# Patient Record
Sex: Female | Born: 1950 | Race: White | Hispanic: No | Smoking: Former smoker
Health system: Southern US, Community
[De-identification: ages and names within clinical notes are randomized; demographics above are authoritative.]

## PROBLEM LIST (undated history)

## (undated) DIAGNOSIS — J45909 Unspecified asthma, uncomplicated: Secondary | ICD-10-CM

## (undated) DIAGNOSIS — M549 Dorsalgia, unspecified: Secondary | ICD-10-CM

## (undated) DIAGNOSIS — F329 Major depressive disorder, single episode, unspecified: Secondary | ICD-10-CM

## (undated) DIAGNOSIS — I10 Essential (primary) hypertension: Secondary | ICD-10-CM

## (undated) DIAGNOSIS — M2550 Pain in unspecified joint: Secondary | ICD-10-CM

## (undated) DIAGNOSIS — R718 Other abnormality of red blood cells: Secondary | ICD-10-CM

## (undated) DIAGNOSIS — E785 Hyperlipidemia, unspecified: Secondary | ICD-10-CM

## (undated) DIAGNOSIS — F419 Anxiety disorder, unspecified: Secondary | ICD-10-CM

## (undated) DIAGNOSIS — F32A Depression, unspecified: Secondary | ICD-10-CM

## (undated) DIAGNOSIS — R7303 Prediabetes: Secondary | ICD-10-CM

## (undated) DIAGNOSIS — E538 Deficiency of other specified B group vitamins: Secondary | ICD-10-CM

## (undated) DIAGNOSIS — T7840XA Allergy, unspecified, initial encounter: Secondary | ICD-10-CM

## (undated) HISTORY — DX: Dorsalgia, unspecified: M54.9

## (undated) HISTORY — DX: Prediabetes: R73.03

## (undated) HISTORY — DX: Anxiety disorder, unspecified: F41.9

## (undated) HISTORY — DX: Other abnormality of red blood cells: R71.8

## (undated) HISTORY — DX: Depression, unspecified: F32.A

## (undated) HISTORY — PX: ADENOIDECTOMY: SUR15

## (undated) HISTORY — PX: COLONOSCOPY: SHX174

## (undated) HISTORY — DX: Essential (primary) hypertension: I10

## (undated) HISTORY — DX: Unspecified asthma, uncomplicated: J45.909

## (undated) HISTORY — PX: KIDNEY STONE SURGERY: SHX686

## (undated) HISTORY — DX: Deficiency of other specified B group vitamins: E53.8

## (undated) HISTORY — DX: Pain in unspecified joint: M25.50

## (undated) HISTORY — DX: Hyperlipidemia, unspecified: E78.5

## (undated) HISTORY — PX: TONSILLECTOMY: SUR1361

## (undated) HISTORY — PX: FOOT SURGERY: SHX648

## (undated) HISTORY — DX: Allergy, unspecified, initial encounter: T78.40XA

---
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
## 2003-05-21 HISTORY — PX: GASTRIC BYPASS: SHX52

## 2009-06-05 HISTORY — PX: KIDNEY STONE SURGERY: SHX686

## 2010-07-10 HISTORY — PX: ABDOMINAL HYSTERECTOMY: SHX81

## 2019-01-08 ENCOUNTER — Encounter: Payer: Self-pay | Admitting: Podiatry

## 2019-01-08 ENCOUNTER — Ambulatory Visit (INDEPENDENT_AMBULATORY_CARE_PROVIDER_SITE_OTHER): Payer: Medicare Other | Admitting: Podiatry

## 2019-01-08 ENCOUNTER — Other Ambulatory Visit: Payer: Self-pay | Admitting: Podiatry

## 2019-01-08 ENCOUNTER — Ambulatory Visit (INDEPENDENT_AMBULATORY_CARE_PROVIDER_SITE_OTHER): Payer: Medicare Other

## 2019-01-08 VITALS — BP 139/80 | HR 69

## 2019-01-08 DIAGNOSIS — G5762 Lesion of plantar nerve, left lower limb: Secondary | ICD-10-CM | POA: Diagnosis not present

## 2019-01-08 DIAGNOSIS — D3610 Benign neoplasm of peripheral nerves and autonomic nervous system, unspecified: Secondary | ICD-10-CM | POA: Diagnosis not present

## 2019-01-08 DIAGNOSIS — M79672 Pain in left foot: Secondary | ICD-10-CM | POA: Diagnosis not present

## 2019-01-08 NOTE — Patient Instructions (Signed)

## 2019-01-10 NOTE — Progress Notes (Signed)
Subjective:   Patient ID: Heidi Mendoza, female   DOB: 68 y.o.   MRN: [id number]   HPI Patient presents stating with only 25-month history of shooting burning pain in her left forefoot and does not remember injury.  States that is very tender and makes it hard for her to walk and is worse with shoe gear or anything that compresses her foot and patient does not smoke and would like to be active but is not able to due to pain   Review of Systems  All other systems reviewed and are negative.       Objective:  Physical Exam Vitals signs and nursing note reviewed.  Constitutional:      Appearance: She is well-developed.  Pulmonary:     Effort: Pulmonary effort is normal.  Musculoskeletal: Normal range of motion.  Skin:    General: Skin is warm.  Neurological:     Mental Status: She is alert.     Neurovascular status intact muscle strength is adequate range of motion is within normal limits with patient noted to have exquisite discomfort third interspace left with shooting radiating discomfort into the adjacent digits with a positive Mulder sign noted.  The metatarsal phalangeal joint is not currently tender and patient has good digital perfusion well oriented x3     Assessment:  Strong probability for neuroma-like symptoms left with possibility for capsulitis or bony injury     Plan:  H&P x-ray reviewed and today I did a sterile prep of the left third interspace and injected proximal to the nerve with a neural lysis injection of purified alcohol Marcaine solution and we will see results in 2 weeks with the possibility for further injections or possible surgical excision depending on response.  Patient was made aware to be very active on it today  X-ray indicates that there is no bony injury and it appears to be strictly a soft tissue pathology at this time

## 2019-01-22 ENCOUNTER — Encounter: Payer: Self-pay | Admitting: Podiatry

## 2019-01-22 ENCOUNTER — Ambulatory Visit (INDEPENDENT_AMBULATORY_CARE_PROVIDER_SITE_OTHER): Payer: Medicare Other | Admitting: Podiatry

## 2019-01-22 DIAGNOSIS — D3610 Benign neoplasm of peripheral nerves and autonomic nervous system, unspecified: Secondary | ICD-10-CM

## 2019-01-22 NOTE — Patient Instructions (Signed)
Pre-Operative Instructions  Congratulations, you have decided to take an important step towards improving your quality of life.  You can be assured that the doctors and staff at [facility] will be with you every step of the way.  Here are some important things you should know:  1. Plan to be at the surgery center/hospital at least 1 (one) hour prior to your scheduled time, unless otherwise directed by the surgical center/hospital staff.  You must have a responsible adult accompany you, remain during the surgery and drive you home.  Make sure you have directions to the surgical center/hospital to ensure you arrive on time. 2. If you are having surgery at [facility] or [facility] hospitals, you will need a copy of your medical history and physical form from your family physician within one month prior to the date of surgery. We will give you a form for your primary physician to complete.  3. We make every effort to accommodate the date you request for surgery.  However, there are times where surgery dates or times have to be moved.  We will contact you as soon as possible if a change in schedule is required.   4. No aspirin/ibuprofen for one week before surgery.  If you are on aspirin, any non-steroidal anti-inflammatory medications (Mobic, Aleve, Ibuprofen) should not be taken seven (7) days prior to your surgery.  You make take Tylenol for pain prior to surgery.  5. Medications - If you are taking daily heart and blood pressure medications, seizure, reflux, allergy, asthma, anxiety, pain or diabetes medications, make sure you notify the surgery center/hospital before the day of surgery so they can tell you which medications you should take or avoid the day of surgery. 6. No food or drink after midnight the night before surgery unless directed otherwise by surgical center/hospital staff. 7. No alcoholic beverages 24-hours prior to surgery.  No smoking 24-hours prior or 24-hours after  surgery. 8. Wear loose pants or shorts. They should be loose enough to fit over bandages, boots, and casts. 9. Don't wear slip-on shoes. Sneakers are preferred. 10. Bring your boot with you to the surgery center/hospital.  Also bring crutches or a walker if your physician has prescribed it for you.  If you do not have this equipment, it will be provided for you after surgery. 11. If you have not been contacted by the surgery center/hospital by the day before your surgery, call to confirm the date and time of your surgery. 12. Leave-time from work may vary depending on the type of surgery you have.  Appropriate arrangements should be made prior to surgery with your employer. 13. Prescriptions will be provided immediately following surgery by your doctor.  Fill these as soon as possible after surgery and take the medication as directed. Pain medications will not be refilled on weekends and must be approved by the doctor. 14. Remove nail polish on the operative foot and avoid getting pedicures prior to surgery. 15. Wash the night before surgery.  The night before surgery wash the foot and leg well with water and the antibacterial soap provided. Be sure to pay special attention to beneath the toenails and in between the toes.  Wash for at least three (3) minutes. Rinse thoroughly with water and dry well with a towel.  Perform this wash unless told not to do so by your physician.  Enclosed: 1 Ice pack (please put in freezer the night before surgery)   1 Hibiclens skin cleaner     Pre-op instructions  If you have any questions regarding the instructions, please do not hesitate to call our office.  [place]: [address], [place], [place] [address] -- [phone]  [place]: [address] , [place] [address] -- [phone]  [place]: [address]  [place], [place] [address] -- [phone]  [place]: [address], [address], [place], [place] [address] -- [phone]  Website:  [email or web address] 

## 2019-01-23 NOTE — Progress Notes (Signed)
Subjective:   Patient ID: Heidi Mendoza, female   DOB: 68 y.o.   MRN: [id number]   HPI Patient states that she had some improvement from the injection but her symptoms are right back to where they were and stated for the first day she had complete resolution of her symptoms   ROS      Objective:  Physical Exam  Neurovascular status intact with patient's third interspace left showing positive Biagio Borg sign with shooting pains into the adjacent digits and radiating-like discomforts.  The metatarsals are also slightly sore but not to the same degree and she does have mild to moderate digital deformity     Assessment:  Strong probability that the problem is related to the aerobic symptoms with possibility of low-grade capsulitis or digital deformities as part of a complicating or contributing factor     Plan:  H&P condition reviewed and at this point I discussed options and she is opted for surgical intervention.  I allowed her to read the consent form reviewing neurectomy of the third interspace left foot and I explained procedure risk and patient wants surgery.  Patient understands no guarantee as far success and understands all risk as outlined and is comfortable with this and what surgical correction.  Patient is allowed to read consent form line by line and signed the consent form after we reviewed and understands no guarantee this will solve the problem and recovery can take from 3 to 6 months.  Scheduled for outpatient surgery and encouraged to call with questions

## 2019-01-30 ENCOUNTER — Encounter: Payer: Self-pay | Admitting: Allergy & Immunology

## 2019-01-30 ENCOUNTER — Telehealth: Payer: Self-pay | Admitting: *Deleted

## 2019-01-30 ENCOUNTER — Ambulatory Visit (INDEPENDENT_AMBULATORY_CARE_PROVIDER_SITE_OTHER): Payer: Medicare Other | Admitting: Allergy & Immunology

## 2019-01-30 VITALS — BP 124/82 | HR 79 | Temp 98.3°F | Resp 18 | Ht 66.0 in | Wt 201.6 lb

## 2019-01-30 DIAGNOSIS — J302 Other seasonal allergic rhinitis: Secondary | ICD-10-CM | POA: Diagnosis not present

## 2019-01-30 DIAGNOSIS — J3089 Other allergic rhinitis: Secondary | ICD-10-CM

## 2019-01-30 DIAGNOSIS — J4540 Moderate persistent asthma, uncomplicated: Secondary | ICD-10-CM

## 2019-01-30 MED ORDER — EPINEPHRINE 0.3 MG/0.3ML IJ SOAJ: 0.3000 mg | Freq: Once | INTRAMUSCULAR | 2 refills | Status: AC

## 2019-01-30 MED ORDER — ALBUTEROL SULFATE (2.5 MG/3ML) 0.083% IN NEBU: 2.5000 mg | INHALATION_SOLUTION | Freq: Four times a day (QID) | RESPIRATORY_TRACT | 5 refills | Status: DC | PRN

## 2019-01-30 NOTE — Telephone Encounter (Signed)
Medical Release form has been faxed to the [facility] for all medical records, and allergy shot prescriptions. Allergy shot consent has been signed, EMS has been completed and given, and an epipen has been prescribed for allergy injections.

## 2019-01-30 NOTE — Progress Notes (Signed)
NEW PATIENT  Date of Service/Encounter:  01/30/19  Referring provider: Davina Poke, FNP   Assessment:   Moderate persistent asthma, uncomplicated  Seasonal and perennial allergic rhinitis  Plan/Recommendations:   1. Moderate persistent asthma, uncomplicated - Lung testing looked good today. - We are not going to make any changes at all. - Daily controller medication(s): Singulair 10mg  daily and Symbicort 160/4.92mcg two puffs twice daily with spacer - Prior to physical activity: albuterol 2 puffs 10-15 minutes before physical activity. - Rescue medications: albuterol 4 puffs every 4-6 hours as needed - Asthma control goals:  * Full participation in all desired activities (may need albuterol before activity) * Albuterol use two time or less a week on average (not counting use with activity) * Cough interfering with sleep two time or less a month * Oral steroids no more than once a year * No hospitalizations  2. Seasonal and perennial allergic rhinitis - We will get your allergy vials and start them again once we get them. - Continue with Singulair (montelukast) 10mg .  - Continue with fluticasone as needed. - We will get outside records.  3. Return in about 6 months (around 07/31/2019).   Subjective:   Heidi Mendoza is a 68 y.o. female presenting today for evaluation of  Chief Complaint  Patient presents with  . Establish Care    Heidi Mendoza has a history of the following: Patient Active Problem List   Diagnosis Date Noted  . Moderate persistent asthma, uncomplicated 26/37/8588  . Seasonal and perennial allergic rhinitis 01/30/2019    History obtained from: chart review and patient.  [name] was referred by Davina Poke, FNP.     Heidi Mendoza is a 68 y.o. female presenting to establish care. She recently moved here from [facility].  She moved here within the last month or so to be with her boyfriend who lives in [place].   Asthma/Respiratory  Symptom History: She has a history of what seems to be a moderate persistent asthma.  Her triggers include heat as well as allergic triggers.  Exercise is also a trigger.  She is currently on Symbicort 160/4.5 mcg 2 puffs twice daily.  She has been on this regimen for 2 years without any prednisone burst.  She has had no hospitalizations or ER visits during this time.  She has not tried decreasing her dose.  ACT score today is 19, indicating excellent asthma control.  Allergic Rhinitis Symptom History: She has a history of chronic congestion as well as sneezing and itchy watery eyes.  She is currently on Singulair daily as well as Claritin as needed and fluticasone as needed.  She had allergy testing done 2 years ago and is on allergy shots every other week.  We do not have her outside records at this point.  She has done very well on the allergy shots.  She was seen by a board-certified allergist in [facility].  Otherwise, there is no history of other atopic diseases, including food allergies, drug allergies, stinging insect allergies, eczema or urticaria. There is no significant infectious history. Vaccinations are up to date.    Past Medical History: Patient Active Problem List   Diagnosis Date Noted  . Moderate persistent asthma, uncomplicated 50/27/7412  . Seasonal and perennial allergic rhinitis 01/30/2019    Medication List:  Allergies as of 01/30/2019   No Known Allergies     Medication List       Accurate as of January 30, 2019  1:21 PM. Always use your most recent med list.        albuterol (2.5 MG/3ML) 0.083% nebulizer solution Commonly known as:  PROVENTIL Take 3 mLs (2.5 mg total) by nebulization every 6 (six) hours as needed for wheezing or shortness of breath.   citalopram 20 MG tablet Commonly known as:  CELEXA Take 20 mg by mouth daily.   EPINEPHrine 0.3 mg/0.3 mL Soaj injection Commonly known as:  EPIPEN 2-PAK Inject 0.3 mLs (0.3 mg total) into the  muscle once for 1 dose.   ESTROVEN PO Take by mouth.   Fish Oil 1200 MG Cpdr Take 1 tablet by mouth daily.   FLUTICASONE PROPIONATE EX Apply topically.   Fluticasone-Salmeterol 500-50 MCG/DOSE Aepb Commonly known as:  ADVAIR Inhale 1 puff into the lungs 2 (two) times daily.   montelukast 10 MG tablet Commonly known as:  SINGULAIR Take 10 mg by mouth at bedtime.   simvastatin 20 MG tablet Commonly known as:  ZOCOR Take 20 mg by mouth daily.   STOOL SOFTENER 100 MG capsule Generic drug:  docusate sodium Take 100 mg by mouth 2 (two) times daily.   telmisartan 20 MG tablet Commonly known as:  MICARDIS Take 20 mg by mouth daily.   TURMERIC COMPLEX/BLACK PEPPER 3-500 MG Caps Generic drug:  Black Pepper-Turmeric Take 500 mg by mouth.   vitamin B-12 1000 MCG tablet Commonly known as:  CYANOCOBALAMIN Take 5,000 mcg by mouth daily.   Vitamin D3 1.25 MG (50000 UT) Caps Take 2,000 mg by mouth.   vitamin E 400 UNIT capsule Take 400 Units by mouth daily.       Birth History: non-contributory  Developmental History: non-contributory.   Past Surgical History: Past Surgical History:  Procedure Laterality Date  . ABDOMINAL HYSTERECTOMY Bilateral 07/10/2010  . ADENOIDECTOMY    . 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 KIDNEY STONE SURGERY N/A 06/05/2009  . TONSILLECTOMY       Family History: History reviewed. No pertinent family history.   Social History: Anelis lives at home with her boyfriend.  She currently volunteers at [employer].  She is widowed, as her husband was killed following injury sustained during an explosion in 2008/04/06.  He worked as an Hotel manager for [employer] and industrial sites.  Her husband died in 04-06-2008, and she is now dating someone who lives in [place].  She moved to [place] to be closer to him.  They both are active with various volunteer activities.  She does have 2 sons.  One lives in [place] and one lives in [place].  She also has a  handful of grandchildren.  Her first husband was in the [employer] and they moved around quite a lot.    Review of Systems: a 14-point review of systems is pertinent for what is mentioned in HPI.  Otherwise, all other systems were negative. Constitutional: negative other than that listed in the HPI Eyes: negative other than that listed in the HPI Ears, nose, mouth, throat, and face: negative other than that listed in the HPI Respiratory: negative other than that listed in the HPI Cardiovascular: negative other than that listed in the HPI Gastrointestinal: negative other than that listed in the HPI Genitourinary: negative other than that listed in the HPI Integument: negative other than that listed in the HPI Hematologic: negative other than that listed in the HPI Musculoskeletal: negative other than that listed in the HPI Neurological: negative other than that listed in the HPI Allergy/Immunologic: negative other than that listed  in the HPI    Objective:   Blood pressure 124/82, pulse 79, temperature 98.3 F (36.8 C), temperature source Oral, resp. rate 18, height 5\' 6"  (1.676 m), weight 201 lb 9.6 oz (91.4 kg), SpO2 93 %. Body mass index is 32.54 kg/m.   Physical Exam:  General: Alert, interactive, in no acute distress. Pleasant female.  Eyes: No conjunctival injection bilaterally, no discharge on the right, no discharge on the left and no Horner-Trantas dots present. PERRL bilaterally. EOMI without pain. No photophobia.  Ears: Right TM pearly gray with normal light reflex, Left TM pearly gray with normal light reflex, Right TM intact without perforation and Left TM intact without perforation.  Nose/Throat: External nose within normal limits and septum midline. Turbinates edematous and pale with clear discharge. Posterior oropharynx erythematous with cobblestoning in the posterior oropharynx. Tonsils 2+ without exudates.  Tongue without thrush. Neck: Supple without thyromegaly.  Trachea midline. Adenopathy: no enlarged lymph nodes appreciated in the anterior cervical, occipital, axillary, epitrochlear, inguinal, or popliteal regions. Lungs: Clear to auscultation without wheezing, rhonchi or rales. No increased work of breathing. CV: Normal S1/S2. No murmurs. Capillary refill <2 seconds.  Abdomen: Nondistended, nontender. No guarding or rebound tenderness. Bowel sounds present in all fields and hypoactive  Skin: Warm and dry, without lesions or rashes. Extremities:  No clubbing, cyanosis or edema. Neuro:   Grossly intact. No focal deficits appreciated. Responsive to questions.  Diagnostic studies:   Spirometry: results normal (FEV1: 2.01/77%, FVC: 2.75/80%, FEV1/FVC: 73%).    Spirometry consistent with normal pattern.  Allergy Studies: none     Allergy testing results were read and interpreted by myself, documented by clinical staff.       Salvatore Marvel, MD Allergy and [place] [facility] [place]

## 2019-01-30 NOTE — Telephone Encounter (Signed)
The patient is transferring vials to our office from [facility]. Called and talked with the Injection room at [facility] to receive vials, they stated that they are shipping her vials out tomorrow and will include paperwork to be filled out by Dr. Ernst Bowler. Once the vials do come in an appointment needs to be made for Heidi Mendoza to come to our office to continue her allergy injections. At this moment it has been over 1 month since she has received her last allergy injection.

## 2019-01-30 NOTE — Patient Instructions (Addendum)
1. Moderate persistent asthma, uncomplicated - Lung testing looked good today. - We are not going to make any changes at all. - Daily controller medication(s): Singulair 10mg  daily and Symbicort 160/4.41mcg two puffs twice daily with spacer - Prior to physical activity: albuterol 2 puffs 10-15 minutes before physical activity. - Rescue medications: albuterol 4 puffs every 4-6 hours as needed - Asthma control goals:  * Full participation in all desired activities (may need albuterol before activity) * Albuterol use two time or less a week on average (not counting use with activity) * Cough interfering with sleep two time or less a month * Oral steroids no more than once a year * No hospitalizations  2. Seasonal and perennial allergic rhinitis - We will get your allergy vials and start them again once we get them. - Continue with Singulair (montelukast) 10mg .  - Continue with fluticasone as needed. - We will get outside records.  3. Return in about 6 months (around 07/31/2019).   Please inform us of any Emergency Department visits, hospitalizations, or changes in symptoms. Call us before going to the ED for breathing or allergy symptoms since we might be able to fit you in for a sick visit. Feel free to contact us anytime with any questions, problems, or concerns.  It was a pleasure to meet you today!  Websites that have reliable patient information: 1. American Academy of Asthma, Allergy, and Immunology: www.aaaai.org 2. Food Allergy Research and Education (FARE): foodallergy.org 3. Mothers of Asthmatics: http://www.asthmacommunitynetwork.org 4. American College of Allergy, Asthma, and Immunology: MonthlyElectricBill.co.uk   Make sure you are registered to vote! If you have moved or changed any of your contact information, you will need to get this updated before voting!    Voter ID laws are POSSIBLY going into effect for the General Election in November 2020! Be prepared! Check out  http://levine.com/ for more details.

## 2019-01-31 ENCOUNTER — Telehealth: Payer: Self-pay | Admitting: *Deleted

## 2019-01-31 NOTE — Telephone Encounter (Signed)
"  I'm a patient of Dr. Paulla Dolly and I'm scheduled for foot surgery on February 24.  I have a question I want to ask him.  The ball of my foot is what's hurting.  He's taking out a nerve on the top of my foot.  How is that going to resolve the problem on the ball of my foot?  That's not where the pain is.  I want to make sure he knows that before I have the surgery."

## 2019-02-05 NOTE — Telephone Encounter (Signed)
I called and left her a message on her voicemail of Dr. Mellody Drown response.  I asked her to call if she has any further questions.

## 2019-02-05 NOTE — Telephone Encounter (Signed)
The nerve is on the bottom. Easier to recover taking nerve out from the top

## 2019-02-13 ENCOUNTER — Telehealth: Payer: Self-pay | Admitting: *Deleted

## 2019-02-13 NOTE — Telephone Encounter (Signed)
I left her a message that we have her surgery at [facility].  Their address is [address] in [place].  I informed her that she is scheduled for February 25 and that someone from the surgical center would call her the Friday or Monday prior to her surgery date.  They will give her the arrival time.  I told her she would not be wearing a boot but will be wearing a surgical shoe after her surgery.  I asked her to call with any other questions she may have.

## 2019-02-13 NOTE — Telephone Encounter (Signed)
"  Could you tell me what time I need to be at the hospital and when my surgery?  Is he going to put me in a boot after my foot heals?  Just give me a call."

## 2019-02-19 ENCOUNTER — Telehealth: Payer: Self-pay | Admitting: *Deleted

## 2019-02-19 NOTE — Telephone Encounter (Signed)
"  I am calling to see what time I need to be there for my surgery tomorrow.  Can you tell me?"  I cannot tell you the arrival time.  You can call the surgical center and ask someone.  "Do you have their phone number?"  Yes, it is [phone].

## 2019-02-20 ENCOUNTER — Encounter: Payer: Self-pay | Admitting: Podiatry

## 2019-02-20 DIAGNOSIS — G5762 Lesion of plantar nerve, left lower limb: Secondary | ICD-10-CM | POA: Diagnosis not present

## 2019-02-21 ENCOUNTER — Telehealth: Payer: Self-pay | Admitting: *Deleted

## 2019-02-21 NOTE — Telephone Encounter (Signed)
Called and left a message for the patient to call me back-stating I was calling to check on her after having surgery with Dr Paulla Dolly on February 20, 2019 and stated to call the [place] office at [phone]. Lattie Haw

## 2019-02-22 ENCOUNTER — Encounter: Payer: Self-pay | Admitting: Podiatry

## 2019-02-23 ENCOUNTER — Encounter: Payer: Self-pay | Admitting: Podiatry

## 2019-02-26 NOTE — Telephone Encounter (Signed)
Pt called states the loose fitting brown sock could be replaced with the hospital sock. I told pt the loose sock was to keep the surgical dressing in place and clean, if she could get the hospital sock over the dressing without disturbing that would be fine. Pt states understanding.

## 2019-02-26 NOTE — Telephone Encounter (Signed)
Left message for pt to call to discuss the brown sock and gray sock question.

## 2019-03-02 ENCOUNTER — Ambulatory Visit (INDEPENDENT_AMBULATORY_CARE_PROVIDER_SITE_OTHER): Payer: Medicare Other | Admitting: Podiatry

## 2019-03-02 ENCOUNTER — Encounter: Payer: Self-pay | Admitting: Podiatry

## 2019-03-02 DIAGNOSIS — D3610 Benign neoplasm of peripheral nerves and autonomic nervous system, unspecified: Secondary | ICD-10-CM

## 2019-03-02 DIAGNOSIS — G5762 Lesion of plantar nerve, left lower limb: Secondary | ICD-10-CM | POA: Diagnosis not present

## 2019-03-02 DIAGNOSIS — Z09 Encounter for follow-up examination after completed treatment for conditions other than malignant neoplasm: Secondary | ICD-10-CM

## 2019-03-07 NOTE — Progress Notes (Signed)
Subjective:   Patient ID: Heidi Mendoza, female   DOB: 68 y.o.   MRN: [id number]   HPI Patient states she is doing great with her left foot   ROS      Objective:  Physical Exam  Neurovascular status intact with patient's left foot doing well with wound edges well coapted edema reduced with minimal discomfort noted and mild numbness between the digits     Assessment:  Doing very well post neurectomy third interspace left     Plan:  Reviewed the continuation of conservative care gradual increase in activity levels and patient will be seen back to recheck on an as-needed basis and is encouraged to call if she has any concerns that occur

## 2019-03-14 NOTE — Progress Notes (Signed)
We received the patient's outside medical records from [facility].  Her testing was done in June 2016 and was positive to all the grasses, all of the trees, all of the weeds, dust mites, dogs, and all of the mold mixes.  Currently, she is completing some vials that were made at this clinic prior to her move here. Once these are done, we will remix them here.  Salvatore Marvel, MD [facility] and [place] [facility] [place]

## 2019-03-15 ENCOUNTER — Encounter: Payer: Self-pay | Admitting: Podiatry

## 2019-03-15 ENCOUNTER — Telehealth: Payer: Self-pay

## 2019-03-15 DIAGNOSIS — J302 Other seasonal allergic rhinitis: Secondary | ICD-10-CM

## 2019-03-15 DIAGNOSIS — J3089 Other allergic rhinitis: Principal | ICD-10-CM

## 2019-03-15 NOTE — Telephone Encounter (Signed)
Provider notified

## 2019-03-15 NOTE — Telephone Encounter (Signed)
-----   Message from Valentina Shaggy, MD sent at 03/14/2019  6:31 AM EDT ----- Can someone remind me to take a look at this person's vials when I am there tomorrow so I can re-order them in our system?

## 2019-03-16 ENCOUNTER — Ambulatory Visit: Payer: Medicare Other

## 2019-03-16 MED ORDER — PREDNISONE 10 MG PO TABS: ORAL_TABLET | ORAL | 0 refills | Status: DC

## 2019-03-16 NOTE — Telephone Encounter (Signed)
I called the patient to discuss her outside records ad her allergy vial situation.  She does report that she was allergic to the entire panel, as was recorded with the testing.  She also tells me that she has been on the Silver Vial for over a year.  She is unclear why she is only on the silver vial but said she was coming in once a week for this.  She did feel some relief from it.  She has only ever received one injection at each visit and has never received any other colored vial.   I explained to her that some allergens can mix together because they degrade each other. She said that she was not aware of that, but she is interested in getting new vials made completely. I explained the schedule to her and recommended that she come next Friday to start instead. I am going to put her on twice weekly injections for now so that she can build up to maintenance. She is fine with this plan.   I was going to get an environmental allergy panel, but given the fact that I confirmed with the patient that she was positive to everything, I think we can hold off on that. I did send in some prednisone for her since she tells me that she was already having symptoms. We will plan to start her vials on Friday March 27th. I will route the note to [employer].   Salvatore Marvel, MD [facility] [place] [facility]

## 2019-03-16 NOTE — Addendum Note (Signed)
Addended by: Valentina Shaggy on: 03/16/2019 08:48 AM   Modules accepted: Orders

## 2019-03-16 NOTE — Addendum Note (Signed)
Addended by: Valentina Shaggy on: 03/16/2019 12:39 PM   Modules accepted: Orders

## 2019-03-21 DIAGNOSIS — J301 Allergic rhinitis due to pollen: Secondary | ICD-10-CM | POA: Diagnosis not present

## 2019-03-21 NOTE — Progress Notes (Signed)
VIALS EXP 03-20-2020

## 2019-03-22 DIAGNOSIS — J3089 Other allergic rhinitis: Secondary | ICD-10-CM | POA: Diagnosis not present

## 2019-03-23 ENCOUNTER — Ambulatory Visit (INDEPENDENT_AMBULATORY_CARE_PROVIDER_SITE_OTHER): Payer: Medicare Other | Admitting: *Deleted

## 2019-03-23 DIAGNOSIS — J309 Allergic rhinitis, unspecified: Secondary | ICD-10-CM

## 2019-03-23 NOTE — Progress Notes (Signed)
Immunotherapy   Patient Details  Name: Heidi Mendoza MRN: [id number] Date of Birth: 05-01-1951  03/23/2019  [name] started injections for  Mold-Dmite & Grass-Weed-Dog Following schedule: B  Frequency:2 times per week Epi-Pen:Epi-Pen Available  Consent signed and patient instructions given. No problems after 30 minutes in the office.   [name] 03/23/2019, 9:55 AM

## 2019-03-30 ENCOUNTER — Ambulatory Visit (INDEPENDENT_AMBULATORY_CARE_PROVIDER_SITE_OTHER): Payer: Medicare Other | Admitting: *Deleted

## 2019-03-30 DIAGNOSIS — J309 Allergic rhinitis, unspecified: Secondary | ICD-10-CM

## 2019-04-09 ENCOUNTER — Ambulatory Visit (INDEPENDENT_AMBULATORY_CARE_PROVIDER_SITE_OTHER): Payer: Medicare Other

## 2019-04-09 DIAGNOSIS — J309 Allergic rhinitis, unspecified: Secondary | ICD-10-CM | POA: Diagnosis not present

## 2019-04-16 ENCOUNTER — Ambulatory Visit (INDEPENDENT_AMBULATORY_CARE_PROVIDER_SITE_OTHER): Payer: Medicare Other

## 2019-04-16 DIAGNOSIS — J309 Allergic rhinitis, unspecified: Secondary | ICD-10-CM

## 2019-04-23 ENCOUNTER — Ambulatory Visit (INDEPENDENT_AMBULATORY_CARE_PROVIDER_SITE_OTHER): Payer: Medicare Other | Admitting: *Deleted

## 2019-04-23 DIAGNOSIS — J309 Allergic rhinitis, unspecified: Secondary | ICD-10-CM | POA: Diagnosis not present

## 2019-04-30 ENCOUNTER — Ambulatory Visit (INDEPENDENT_AMBULATORY_CARE_PROVIDER_SITE_OTHER): Payer: Medicare Other

## 2019-04-30 DIAGNOSIS — J309 Allergic rhinitis, unspecified: Secondary | ICD-10-CM

## 2019-05-07 ENCOUNTER — Ambulatory Visit (INDEPENDENT_AMBULATORY_CARE_PROVIDER_SITE_OTHER): Payer: Medicare Other

## 2019-05-07 DIAGNOSIS — J309 Allergic rhinitis, unspecified: Secondary | ICD-10-CM

## 2019-05-09 ENCOUNTER — Telehealth: Payer: Self-pay | Admitting: Allergy & Immunology

## 2019-05-09 NOTE — Telephone Encounter (Signed)
Patient has a prescription from a former doctor, called simvastatin. She wants to know if Dr. Ernst Bowler wants her to stay on it and if so, if he will send in a prescription for it to [facility], for a 90 day supply. [phone]

## 2019-05-09 NOTE — Telephone Encounter (Signed)
Called patient and informed that this is a medication for cholesterol and that the patient will need to contact her PCP to refill. Patient verbalized understanding.

## 2019-05-14 ENCOUNTER — Ambulatory Visit (INDEPENDENT_AMBULATORY_CARE_PROVIDER_SITE_OTHER): Payer: Medicare Other

## 2019-05-14 DIAGNOSIS — J309 Allergic rhinitis, unspecified: Secondary | ICD-10-CM

## 2019-05-22 ENCOUNTER — Ambulatory Visit (INDEPENDENT_AMBULATORY_CARE_PROVIDER_SITE_OTHER): Payer: Medicare Other | Admitting: *Deleted

## 2019-05-22 DIAGNOSIS — J309 Allergic rhinitis, unspecified: Secondary | ICD-10-CM | POA: Diagnosis not present

## 2019-05-28 ENCOUNTER — Ambulatory Visit (INDEPENDENT_AMBULATORY_CARE_PROVIDER_SITE_OTHER): Payer: Medicare Other

## 2019-05-28 DIAGNOSIS — J309 Allergic rhinitis, unspecified: Secondary | ICD-10-CM | POA: Diagnosis not present

## 2019-06-04 ENCOUNTER — Ambulatory Visit (INDEPENDENT_AMBULATORY_CARE_PROVIDER_SITE_OTHER): Payer: Medicare Other

## 2019-06-04 DIAGNOSIS — J309 Allergic rhinitis, unspecified: Secondary | ICD-10-CM | POA: Diagnosis not present

## 2019-06-11 ENCOUNTER — Ambulatory Visit (INDEPENDENT_AMBULATORY_CARE_PROVIDER_SITE_OTHER): Payer: Medicare Other | Admitting: *Deleted

## 2019-06-11 DIAGNOSIS — J309 Allergic rhinitis, unspecified: Secondary | ICD-10-CM | POA: Diagnosis not present

## 2019-06-19 ENCOUNTER — Ambulatory Visit (INDEPENDENT_AMBULATORY_CARE_PROVIDER_SITE_OTHER): Payer: Medicare Other | Admitting: *Deleted

## 2019-06-19 DIAGNOSIS — J309 Allergic rhinitis, unspecified: Secondary | ICD-10-CM | POA: Diagnosis not present

## 2019-06-25 ENCOUNTER — Ambulatory Visit (INDEPENDENT_AMBULATORY_CARE_PROVIDER_SITE_OTHER): Payer: Medicare Other | Admitting: *Deleted

## 2019-06-25 DIAGNOSIS — J309 Allergic rhinitis, unspecified: Secondary | ICD-10-CM | POA: Diagnosis not present

## 2019-07-02 ENCOUNTER — Ambulatory Visit (INDEPENDENT_AMBULATORY_CARE_PROVIDER_SITE_OTHER): Payer: Medicare Other

## 2019-07-02 DIAGNOSIS — J309 Allergic rhinitis, unspecified: Secondary | ICD-10-CM

## 2019-07-09 ENCOUNTER — Ambulatory Visit (INDEPENDENT_AMBULATORY_CARE_PROVIDER_SITE_OTHER): Payer: Medicare Other

## 2019-07-09 DIAGNOSIS — J309 Allergic rhinitis, unspecified: Secondary | ICD-10-CM

## 2019-07-17 ENCOUNTER — Ambulatory Visit (INDEPENDENT_AMBULATORY_CARE_PROVIDER_SITE_OTHER): Payer: Medicare Other | Admitting: *Deleted

## 2019-07-17 DIAGNOSIS — J309 Allergic rhinitis, unspecified: Secondary | ICD-10-CM | POA: Diagnosis not present

## 2019-07-23 ENCOUNTER — Ambulatory Visit (INDEPENDENT_AMBULATORY_CARE_PROVIDER_SITE_OTHER): Payer: Medicare Other

## 2019-07-23 DIAGNOSIS — J309 Allergic rhinitis, unspecified: Secondary | ICD-10-CM

## 2019-07-31 ENCOUNTER — Other Ambulatory Visit: Payer: Self-pay

## 2019-07-31 ENCOUNTER — Ambulatory Visit: Payer: Self-pay | Admitting: *Deleted

## 2019-07-31 ENCOUNTER — Encounter: Payer: Self-pay | Admitting: Allergy & Immunology

## 2019-07-31 ENCOUNTER — Ambulatory Visit: Payer: Medicare Other | Admitting: Allergy & Immunology

## 2019-07-31 ENCOUNTER — Ambulatory Visit (INDEPENDENT_AMBULATORY_CARE_PROVIDER_SITE_OTHER): Payer: Medicare Other | Admitting: Allergy & Immunology

## 2019-07-31 VITALS — BP 136/84 | HR 78 | Resp 16 | Ht 65.0 in

## 2019-07-31 DIAGNOSIS — J3089 Other allergic rhinitis: Secondary | ICD-10-CM | POA: Diagnosis not present

## 2019-07-31 DIAGNOSIS — J4540 Moderate persistent asthma, uncomplicated: Secondary | ICD-10-CM | POA: Diagnosis not present

## 2019-07-31 DIAGNOSIS — J302 Other seasonal allergic rhinitis: Secondary | ICD-10-CM | POA: Diagnosis not present

## 2019-07-31 DIAGNOSIS — J309 Allergic rhinitis, unspecified: Secondary | ICD-10-CM

## 2019-07-31 NOTE — Progress Notes (Signed)
FOLLOW UP  Date of Service/Encounter:  07/31/19   Assessment:   Moderate persistent asthma, uncomplicated   Seasonal and perennial allergic rhinitis (grasses, trees, weeds, indoor and outdoor molds, cat, dog, dust mites) - on allergen immunotherapy  Plan/Recommendations:   1. Moderate persistent asthma, uncomplicated - Lung testing looked good today. - We will stop the montelukast since you have done without it for so long.  - Daily controller medication(s): Symbicort 160/4.55mcg two puffs twice daily with spacer - Prior to physical activity: albuterol 2 puffs 10-15 minutes before physical activity. - Rescue medications: albuterol 4 puffs every 4-6 hours as needed - Asthma control goals:  * Full participation in all desired activities (may need albuterol before activity) * Albuterol use two time or less a week on average (not counting use with activity) * Cough interfering with sleep two time or less a month * Oral steroids no more than once a year * No hospitalizations  2. Seasonal and perennial allergic rhinitis - We will get your allergy vials at the same schedule.  - Continue with fluticasone as needed. - Be sure to use an antihistamine on shot days.   3. Return in about 6 months (around 01/31/2020). This can be an in-person, a virtual Webex or a telephone follow up visit.   Subjective:   Heidi Mendoza is a 68 y.o. female presenting today for follow up of  Chief Complaint  Patient presents with  . Allergic Rhinitis     Heidi Mendoza has a history of the following: Patient Active Problem List   Diagnosis Date Noted  . Moderate persistent asthma, uncomplicated 43/15/4008  . Seasonal and perennial allergic rhinitis 01/30/2019    History obtained from: chart review and patient.  Heidi Mendoza is a 68 y.o. female presenting for a follow up visit.  She was last seen as a new patient in February 2020.  At that time, her lung testing looked great.  We did not make any medication  changes and instead continued with Singulair 10 mg daily and Symbicort 160/4.5 mcg 2 puffs in the morning and 2 puffs at night.  She was interested in restarting her allergy shots.  We got them from her previous allergist.  We continued with Singulair 10 mg daily as well as Flonase as needed.  Since last visit, she has done well.  Asthma/Respiratory Symptom History: She remains on the Symbicort 2 puffs in the morning and 2 puffs at night.  She actually stopped taking her Singulair because she ran out 2 weeks ago.  She feels no different without the Singulair.  She is open to stopping it completely.  She has not been using her rescue inhaler at all.  She reports good sleep.  Allergic Rhinitis Symptom History: She remains on the allergen immunotherapy. She reports that her reactions have been fairly well controlled. She does feel better with the allergy shots compared to when she did not have them.  She also remains on her Flonase, which she is using only as needed.  She only uses antihistamine on her shot days.  Heidi Mendoza is on allergen immunotherapy. She receives two injections. Immunotherapy script #1 contains molds and dust mites. She currently receives 0.59mL of the RED vial (1/100). Immunotherapy script #2 contains weeds, grasses and dog. She currently receives 0.77mL of the RED vial (1/100). She started shots March of 2020 and reached maintenance in August of 2020.  Otherwise, there have been no changes to her past medical history, surgical history, family history, or  social history.  She has been staying safe during the coronavirus pandemic.  She will occasionally visit an isolated friend in [place], [place].  Otherwise, she stays to herself.    Review of Systems  Constitutional: Negative.  Negative for chills, fever, malaise/fatigue and weight loss.  HENT: Negative.  Negative for congestion, ear discharge, ear pain and sore throat.        Positive for mild rhinorrhea.  Eyes: Negative for  pain, discharge and redness.  Respiratory: Negative for cough, sputum production, shortness of breath and wheezing.   Cardiovascular: Negative.  Negative for chest pain and palpitations.  Gastrointestinal: Negative for abdominal pain, constipation, diarrhea, heartburn, nausea and vomiting.  Skin: Negative.  Negative for itching and rash.  Neurological: Negative for dizziness and headaches.  Endo/Heme/Allergies: Negative for environmental allergies. Does not bruise/bleed easily.       Objective:   Blood pressure 136/84, pulse 78, resp. rate 16, height 5\' 5"  (1.651 m), SpO2 96 %. Body mass index is 33.55 kg/m.   Physical Exam:  Physical Exam  Constitutional: She appears well-developed.  Pleasant talkative female.  HENT:  Head: Normocephalic and atraumatic.  Right Ear: Tympanic membrane, external ear and ear canal normal.  Left Ear: Tympanic membrane, external ear and ear canal normal.  Nose: Mucosal edema present. No rhinorrhea, nasal deformity or septal deviation. No epistaxis. Right sinus exhibits no maxillary sinus tenderness and no frontal sinus tenderness. Left sinus exhibits no maxillary sinus tenderness and no frontal sinus tenderness.  Mouth/Throat: Uvula is midline and oropharynx is clear and moist. Mucous membranes are not pale and not dry.  Eyes: Pupils are equal, round, and reactive to light. Conjunctivae and EOM are normal. Right eye exhibits no chemosis and no discharge. Left eye exhibits no chemosis and no discharge. Right conjunctiva is not injected. Left conjunctiva is not injected.  Cardiovascular: Normal rate, regular rhythm and normal heart sounds.  Respiratory: Effort normal and breath sounds normal. No accessory muscle usage. No tachypnea. No respiratory distress. She has no wheezes. She has no rhonchi. She has no rales. She exhibits no tenderness.  Moving air well in all lung fields.  Lymphadenopathy:    She has no cervical adenopathy.  Neurological: She is  alert.  Skin: No abrasion, no petechiae and no rash noted. Rash is not papular, not vesicular and not urticarial. No erythema. No pallor.  No eczematous or urticarial lesions noted.  Psychiatric: She has a normal mood and affect.     Diagnostic studies:    Spirometry: results normal (FEV1: 2.24/81%, FVC: 2.96/79%, FEV1/FVC: 76%).    Spirometry consistent with normal pattern.   Allergy Studies: none      Salvatore Marvel, MD  [facility] [place] [facility] [place]

## 2019-07-31 NOTE — Patient Instructions (Addendum)
1. Moderate persistent asthma, uncomplicated - Lung testing looked good today. - We will stop the montelukast since you have done without it for so long.  - Daily controller medication(s): Symbicort 160/4.67mcg two puffs twice daily with spacer - Prior to physical activity: albuterol 2 puffs 10-15 minutes before physical activity. - Rescue medications: albuterol 4 puffs every 4-6 hours as needed - Asthma control goals:  * Full participation in all desired activities (may need albuterol before activity) * Albuterol use two time or less a week on average (not counting use with activity) * Cough interfering with sleep two time or less a month * Oral steroids no more than once a year * No hospitalizations  2. Seasonal and perennial allergic rhinitis - We will get your allergy vials at the same schedule.  - Continue with fluticasone as needed. - Be sure to use an antihistamine on shot days.   3. Return in about 6 months (around 01/31/2020). This can be an in-person, a virtual Webex or a telephone follow up visit.   Please inform us of any Emergency Department visits, hospitalizations, or changes in symptoms. Call us before going to the ED for breathing or allergy symptoms since we might be able to fit you in for a sick visit. Feel free to contact us anytime with any questions, problems, or concerns.  It was a pleasure to see you again today!  Websites that have reliable patient information: 1. American Academy of Asthma, Allergy, and Immunology: www.aaaai.org 2. Food Allergy Research and Education (FARE): foodallergy.org 3. Mothers of Asthmatics: http://www.asthmacommunitynetwork.org 4. American College of Allergy, Asthma, and Immunology: www.acaai.org  "Like" [place] on Facebook and Instagram for our latest updates!      Make sure you are registered to vote! If you have moved or changed any of your contact information, you will need to get this updated before voting!  In some cases, you MAY  be able to register to vote online: [email or web address]    Voter ID laws are NOT going into effect for the General Election in November 2020! DO NOT let this stop you from exercising your right to vote!   Absentee voting is the SAFEST way to vote during the coronavirus pandemic!   Download and print an absentee ballot request form at [email or web address] or you can scan the QR code below with your smart phone:      More information on absentee ballots can be found here: [email or web address]

## 2019-08-06 ENCOUNTER — Ambulatory Visit (INDEPENDENT_AMBULATORY_CARE_PROVIDER_SITE_OTHER): Payer: Medicare Other | Admitting: *Deleted

## 2019-08-06 DIAGNOSIS — J309 Allergic rhinitis, unspecified: Secondary | ICD-10-CM

## 2019-08-14 ENCOUNTER — Ambulatory Visit (INDEPENDENT_AMBULATORY_CARE_PROVIDER_SITE_OTHER): Payer: Medicare Other | Admitting: *Deleted

## 2019-08-14 DIAGNOSIS — J309 Allergic rhinitis, unspecified: Secondary | ICD-10-CM

## 2019-08-21 ENCOUNTER — Ambulatory Visit (INDEPENDENT_AMBULATORY_CARE_PROVIDER_SITE_OTHER): Payer: Medicare Other | Admitting: *Deleted

## 2019-08-21 DIAGNOSIS — J309 Allergic rhinitis, unspecified: Secondary | ICD-10-CM

## 2019-08-27 ENCOUNTER — Telehealth: Payer: Self-pay | Admitting: Allergy & Immunology

## 2019-08-27 ENCOUNTER — Other Ambulatory Visit: Payer: Self-pay | Admitting: *Deleted

## 2019-08-27 ENCOUNTER — Encounter: Payer: Self-pay | Admitting: Allergy

## 2019-08-27 ENCOUNTER — Ambulatory Visit (INDEPENDENT_AMBULATORY_CARE_PROVIDER_SITE_OTHER): Payer: Medicare Other

## 2019-08-27 DIAGNOSIS — J309 Allergic rhinitis, unspecified: Secondary | ICD-10-CM | POA: Diagnosis not present

## 2019-08-27 MED ORDER — FLUTICASONE PROPIONATE 50 MCG/ACT NA SUSP: 2.0000 | Freq: Every day | NASAL | 1 refills | Status: DC | PRN

## 2019-08-27 NOTE — Progress Notes (Signed)
Patient denied any local or systemic reaction with last injection. However upon return to office with todays injection she admitted that she did have a large local reaction to injections and itching similar to today's reaction with her last injection. I did educate on being honest with the injection staff about any reactions so that we can adjust her dosing accordingly. Patient verbalized understanding of this.

## 2019-08-27 NOTE — Telephone Encounter (Signed)
Prescription has been sent in. Called patient and informed. Patient verbalized understanding.  

## 2019-08-27 NOTE — Telephone Encounter (Signed)
Patient stopped in requesting a refill on a medication from her previous allergist. Fluticasone Propionate Nasal Spray USP. [facility]. 90 day supply.

## 2019-08-27 NOTE — Progress Notes (Signed)
Patient developed itching on her anterior chest and groin area about 1 hour after red 0.4cc. Apparently she had similar symptoms last week but failed to verbalize to our staff.  She was given benadryl 50mg  and prednisone 30mg  in the office. Vitals were stable. Upon discharge was feeling better. She did have large localized reactions.   Will hold red 0.1cc dose for 4 injections and then increase by 0.05cc as tolerated. Advised patient to make sure she takes her antihistamines on the days of her injections.

## 2019-08-28 ENCOUNTER — Telehealth: Payer: Self-pay | Admitting: *Deleted

## 2019-08-28 NOTE — Telephone Encounter (Signed)
Called to follow up with patient after an allergic reaction she had yesterday in office. Left voicemail asking or patient to call back. Will need to follow up with another phone call.

## 2019-08-29 NOTE — Telephone Encounter (Signed)
Called and left a message for patient to call the office to follow up in regards to her reaction.

## 2019-08-30 NOTE — Telephone Encounter (Signed)
I spoke with Heidi Mendoza. She is doing well with no further issues. She will call back with any further questions.

## 2019-09-04 ENCOUNTER — Ambulatory Visit (INDEPENDENT_AMBULATORY_CARE_PROVIDER_SITE_OTHER): Payer: Medicare Other | Admitting: *Deleted

## 2019-09-04 DIAGNOSIS — J309 Allergic rhinitis, unspecified: Secondary | ICD-10-CM

## 2019-09-10 ENCOUNTER — Ambulatory Visit (INDEPENDENT_AMBULATORY_CARE_PROVIDER_SITE_OTHER): Payer: Medicare Other

## 2019-09-10 DIAGNOSIS — J309 Allergic rhinitis, unspecified: Secondary | ICD-10-CM

## 2019-09-17 ENCOUNTER — Ambulatory Visit (INDEPENDENT_AMBULATORY_CARE_PROVIDER_SITE_OTHER): Payer: Medicare Other

## 2019-09-17 DIAGNOSIS — J309 Allergic rhinitis, unspecified: Secondary | ICD-10-CM

## 2019-09-24 ENCOUNTER — Ambulatory Visit (INDEPENDENT_AMBULATORY_CARE_PROVIDER_SITE_OTHER): Payer: Medicare Other

## 2019-09-24 DIAGNOSIS — J309 Allergic rhinitis, unspecified: Secondary | ICD-10-CM | POA: Diagnosis not present

## 2019-10-09 ENCOUNTER — Ambulatory Visit (INDEPENDENT_AMBULATORY_CARE_PROVIDER_SITE_OTHER): Payer: Medicare Other | Admitting: *Deleted

## 2019-10-09 DIAGNOSIS — J309 Allergic rhinitis, unspecified: Secondary | ICD-10-CM | POA: Diagnosis not present

## 2019-10-15 ENCOUNTER — Ambulatory Visit (INDEPENDENT_AMBULATORY_CARE_PROVIDER_SITE_OTHER): Payer: Medicare Other

## 2019-10-15 DIAGNOSIS — J309 Allergic rhinitis, unspecified: Secondary | ICD-10-CM | POA: Diagnosis not present

## 2019-10-23 ENCOUNTER — Ambulatory Visit (INDEPENDENT_AMBULATORY_CARE_PROVIDER_SITE_OTHER): Payer: Medicare Other

## 2019-10-23 DIAGNOSIS — J309 Allergic rhinitis, unspecified: Secondary | ICD-10-CM

## 2019-10-29 ENCOUNTER — Ambulatory Visit (INDEPENDENT_AMBULATORY_CARE_PROVIDER_SITE_OTHER): Payer: Medicare Other

## 2019-10-29 DIAGNOSIS — J309 Allergic rhinitis, unspecified: Secondary | ICD-10-CM | POA: Diagnosis not present

## 2019-11-01 DIAGNOSIS — J301 Allergic rhinitis due to pollen: Secondary | ICD-10-CM

## 2019-11-05 DIAGNOSIS — J3089 Other allergic rhinitis: Secondary | ICD-10-CM | POA: Diagnosis not present

## 2019-11-14 ENCOUNTER — Ambulatory Visit (INDEPENDENT_AMBULATORY_CARE_PROVIDER_SITE_OTHER): Payer: Medicare Other

## 2019-11-14 DIAGNOSIS — J309 Allergic rhinitis, unspecified: Secondary | ICD-10-CM

## 2019-11-20 ENCOUNTER — Ambulatory Visit (INDEPENDENT_AMBULATORY_CARE_PROVIDER_SITE_OTHER): Payer: Medicare Other

## 2019-11-20 DIAGNOSIS — J309 Allergic rhinitis, unspecified: Secondary | ICD-10-CM

## 2019-11-27 ENCOUNTER — Ambulatory Visit (INDEPENDENT_AMBULATORY_CARE_PROVIDER_SITE_OTHER): Payer: Medicare Other | Admitting: *Deleted

## 2019-11-27 DIAGNOSIS — J309 Allergic rhinitis, unspecified: Secondary | ICD-10-CM

## 2019-12-04 ENCOUNTER — Ambulatory Visit (INDEPENDENT_AMBULATORY_CARE_PROVIDER_SITE_OTHER): Payer: Medicare Other

## 2019-12-04 DIAGNOSIS — J309 Allergic rhinitis, unspecified: Secondary | ICD-10-CM | POA: Diagnosis not present

## 2019-12-11 ENCOUNTER — Ambulatory Visit (INDEPENDENT_AMBULATORY_CARE_PROVIDER_SITE_OTHER): Payer: Medicare Other

## 2019-12-11 DIAGNOSIS — J309 Allergic rhinitis, unspecified: Secondary | ICD-10-CM | POA: Diagnosis not present

## 2019-12-12 ENCOUNTER — Ambulatory Visit: Payer: Medicare Other | Admitting: Family

## 2019-12-18 ENCOUNTER — Other Ambulatory Visit: Payer: Self-pay

## 2019-12-18 ENCOUNTER — Encounter: Payer: Self-pay | Admitting: Family

## 2019-12-18 ENCOUNTER — Ambulatory Visit (INDEPENDENT_AMBULATORY_CARE_PROVIDER_SITE_OTHER): Payer: Medicare Other | Admitting: Family

## 2019-12-18 ENCOUNTER — Encounter: Payer: Self-pay | Admitting: Gastroenterology

## 2019-12-18 ENCOUNTER — Ambulatory Visit (INDEPENDENT_AMBULATORY_CARE_PROVIDER_SITE_OTHER): Payer: Medicare Other

## 2019-12-18 DIAGNOSIS — E785 Hyperlipidemia, unspecified: Secondary | ICD-10-CM | POA: Insufficient documentation

## 2019-12-18 DIAGNOSIS — Z1231 Encounter for screening mammogram for malignant neoplasm of breast: Secondary | ICD-10-CM

## 2019-12-18 DIAGNOSIS — Z1211 Encounter for screening for malignant neoplasm of colon: Secondary | ICD-10-CM

## 2019-12-18 DIAGNOSIS — I10 Essential (primary) hypertension: Secondary | ICD-10-CM | POA: Insufficient documentation

## 2019-12-18 DIAGNOSIS — E782 Mixed hyperlipidemia: Secondary | ICD-10-CM

## 2019-12-18 DIAGNOSIS — F329 Major depressive disorder, single episode, unspecified: Secondary | ICD-10-CM

## 2019-12-18 DIAGNOSIS — J309 Allergic rhinitis, unspecified: Secondary | ICD-10-CM | POA: Diagnosis not present

## 2019-12-18 DIAGNOSIS — F419 Anxiety disorder, unspecified: Secondary | ICD-10-CM | POA: Insufficient documentation

## 2019-12-18 DIAGNOSIS — F32A Depression, unspecified: Secondary | ICD-10-CM | POA: Insufficient documentation

## 2019-12-18 LAB — CBC WITH DIFFERENTIAL/PLATELET
Basophils Absolute: 0 10*3/uL (ref 0.0–0.1)
Basophils Relative: 0.8 % (ref 0.0–3.0)
Eosinophils Absolute: 0.1 10*3/uL (ref 0.0–0.7)
Eosinophils Relative: 2.9 % (ref 0.0–5.0)
HCT: 41.3 % (ref 36.0–46.0)
Hemoglobin: 13.6 g/dL (ref 12.0–15.0)
Lymphocytes Relative: 33.9 % (ref 12.0–46.0)
Lymphs Abs: 1.5 10*3/uL (ref 0.7–4.0)
MCHC: 32.9 g/dL (ref 30.0–36.0)
MCV: 87.6 fl (ref 78.0–100.0)
Monocytes Absolute: 0.3 10*3/uL (ref 0.1–1.0)
Monocytes Relative: 7 % (ref 3.0–12.0)
Neutro Abs: 2.5 10*3/uL (ref 1.4–7.7)
Neutrophils Relative %: 55.4 % (ref 43.0–77.0)
Platelets: 190 10*3/uL (ref 150.0–400.0)
RBC: 4.72 Mil/uL (ref 3.87–5.11)
RDW: 13.9 % (ref 11.5–15.5)
WBC: 4.5 10*3/uL (ref 4.0–10.5)

## 2019-12-18 LAB — IBC + FERRITIN
Ferritin: 73.4 ng/mL (ref 10.0–291.0)
Iron: 120 ug/dL (ref 42–145)
Saturation Ratios: 41.2 % (ref 20.0–50.0)
Transferrin: 208 mg/dL — ABNORMAL LOW (ref 212.0–360.0)

## 2019-12-18 LAB — COMPREHENSIVE METABOLIC PANEL
ALT: 13 U/L (ref 0–35)
AST: 14 U/L (ref 0–37)
Albumin: 4.2 g/dL (ref 3.5–5.2)
Alkaline Phosphatase: 91 U/L (ref 39–117)
BUN: 12 mg/dL (ref 6–23)
CO2: 31 mEq/L (ref 19–32)
Calcium: 9.4 mg/dL (ref 8.4–10.5)
Chloride: 103 mEq/L (ref 96–112)
Creatinine, Ser: 0.67 mg/dL (ref 0.40–1.20)
GFR: 87.87 mL/min (ref >60.00)
Glucose, Bld: 136 mg/dL — ABNORMAL HIGH (ref 70–99)
Potassium: 4 mEq/L (ref 3.5–5.1)
Sodium: 141 mEq/L (ref 135–145)
Total Bilirubin: 0.4 mg/dL (ref 0.2–1.2)
Total Protein: 6.5 g/dL (ref 6.0–8.3)

## 2019-12-18 MED ORDER — TELMISARTAN 20 MG PO TABS: 20.0000 mg | ORAL_TABLET | Freq: Every day | ORAL | 0 refills | Status: DC

## 2019-12-18 MED ORDER — SIMVASTATIN 20 MG PO TABS: 20.0000 mg | ORAL_TABLET | Freq: Every day | ORAL | 0 refills | Status: DC

## 2019-12-18 MED ORDER — CITALOPRAM HYDROBROMIDE 20 MG PO TABS: 20.0000 mg | ORAL_TABLET | Freq: Every day | ORAL | 3 refills | Status: AC

## 2019-12-18 MED ORDER — SIMVASTATIN 20 MG PO TABS: 20.0000 mg | ORAL_TABLET | Freq: Every day | ORAL | 3 refills | Status: AC

## 2019-12-18 MED ORDER — CITALOPRAM HYDROBROMIDE 20 MG PO TABS: 20.0000 mg | ORAL_TABLET | Freq: Every day | ORAL | 0 refills | Status: DC

## 2019-12-18 MED ORDER — TELMISARTAN 20 MG PO TABS: 20.0000 mg | ORAL_TABLET | Freq: Every day | ORAL | 3 refills | Status: DC

## 2019-12-18 NOTE — Progress Notes (Signed)
Heidi Mendoza is a 68 y.o. female with the following history as recorded in EpicCare:  Patient Active Problem List   Diagnosis Date Noted  . Hypertension 12/18/2019  . Hyperlipidemia 12/18/2019  . Anxiety and depression 12/18/2019  . Moderate persistent asthma, uncomplicated 00/86/7619  . Seasonal and perennial allergic rhinitis 01/30/2019    Current Outpatient Medications  Medication Sig Dispense Refill  . Black Pepper-Turmeric (TURMERIC COMPLEX/BLACK PEPPER) 3-500 MG CAPS Take 500 mg by mouth.    . Cholecalciferol (VITAMIN D3) 1.25 MG (50000 UT) CAPS Take 2,000 mg by mouth.    . citalopram (CELEXA) 20 MG tablet Take 1 tablet (20 mg total) by mouth daily. 90 tablet 3  . CONTRAVE 8-90 MG TB12     . docusate sodium (STOOL SOFTENER) 100 MG capsule Take 100 mg by mouth 2 (two) times daily.    . Fluticasone-Salmeterol (ADVAIR) 500-50 MCG/DOSE AEPB Inhale 1 puff into the lungs 2 (two) times daily.    . simvastatin (ZOCOR) 20 MG tablet Take 1 tablet (20 mg total) by mouth daily. 90 tablet 3  . telmisartan (MICARDIS) 20 MG tablet Take 1 tablet (20 mg total) by mouth daily. 90 tablet 3  . vitamin B-12 (CYANOCOBALAMIN) 1000 MCG tablet Take 5,000 mcg by mouth daily.    . vitamin E 400 UNIT capsule Take 400 Units by mouth daily.     No current facility-administered medications for this visit.    Allergies: Patient has no known allergies.  Past Medical History:  Diagnosis Date  . Asthma     Past Surgical History:  Procedure Laterality Date  . ABDOMINAL HYSTERECTOMY Bilateral 07/10/2010  . ADENOIDECTOMY    . 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 KIDNEY STONE SURGERY N/A 06/05/2009  . TONSILLECTOMY      History reviewed. No pertinent family history.  Social History   Tobacco Use  . Smoking status: Never Smoker  . Smokeless tobacco: Never Used  Substance Use Topics  . 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  Head: Normocephalic and atraumatic  Eyes: Sclera and conjunctiva clear; pupils round and reactive to light; extraocular movements intact  Lungs: Respirations unlabored; clear to auscultation bilaterally without wheeze, rales, rhonchi  CVS exam: normal rate and regular rhythm.  Musculoskeletal: No deformities; no active joint inflammation  Extremities: No edema, cyanosis, clubbing  Vessels: Symmetric bilaterally  Neurologic: Alert and oriented; speech intact; face symmetrical;  moves all extremities well; CNII-XII intact without focal deficit   Assessment:  1. High bone marrow iron stores   2. Essential hypertension   3. Mixed hyperlipidemia   4. Anxiety and depression   5. Class 3 severe obesity with serious comorbidity in adult, unspecified BMI, unspecified obesity type ([place])   6. Screening mammogram, encounter for   7. Encounter for screening colonoscopy     Plan:  1. Update iron panel; refer to hematology; 2. Stable; refill updated; check CMP today; 3. Stable; refill updated on Zocor 20 mg; will not  check lipid pane today as patient has been off medication for 1 month; 4. Stable; refill updated on Celexa 20 mg daily; 5. Refer to Healthy Weight loss clinic through [facility]; 6. Order updated; 7. Order updated;   Will hold on pneumonia vaccines and Tdap until clearance by asthma/ allergy provider.   This visit occurred during the SARS-CoV-2 public health emergency.  Safety protocols were in place, including screening questions prior to the visit, additional usage of staff PPE, and extensive cleaning of exam room while observing appropriate contact time as indicated for disinfecting solutions.     No follow-ups on file.  Orders Placed This Encounter  Procedures  . MM Digital Screening    Standing Status:   Future    Standing Expiration Date:   02/17/2021    Order Specific Question:   Reason for Exam (SYMPTOM  OR DIAGNOSIS REQUIRED)    Answer:   screening mammogram    Order Specific Question:   Preferred imaging location?    Answer:   [facility]  . CBC w/Diff    Standing Status:   Future    Standing Expiration Date:   12/17/2020  . Comp Met (CMET)    Standing Status:   Future    Standing Expiration Date:   12/17/2020  . Iron, TIBC and Ferritin Panel    Standing Status:   Future    Standing Expiration Date:   12/17/2020  . Ambulatory referral to Hematology    Referral Priority:   Routine    Referral Type:   Consultation    Referral Reason:   Specialty Services Required    Requested Specialty:   Oncology    Number of Visits Requested:   1  . Amb Ref to Medical Weight Management    Referral Priority:   Routine    Referral Type:   Consultation    Number of Visits Requested:   1  . Ambulatory referral to Gastroenterology    Referral Priority:   Routine    Referral Type:   Consultation    Referral Reason:   Specialty Services Required    Number of Visits Requested:   1    Requested Prescriptions   Signed Prescriptions Disp Refills  . citalopram (CELEXA) 20 MG tablet 90  tablet 3    Sig: Take 1 tablet (20 mg total) by mouth daily.  . simvastatin (ZOCOR) 20 MG tablet 90 tablet 3    Sig: Take 1 tablet (20 mg total) by mouth daily.  Marland Kitchen telmisartan (MICARDIS) 20 MG tablet 90 tablet 3    Sig: Take 1 tablet (20 mg total) by mouth daily.

## 2019-12-19 ENCOUNTER — Encounter: Payer: Self-pay | Admitting: Family

## 2019-12-20 ENCOUNTER — Encounter: Payer: Self-pay | Admitting: Family

## 2019-12-25 ENCOUNTER — Ambulatory Visit (INDEPENDENT_AMBULATORY_CARE_PROVIDER_SITE_OTHER): Payer: Medicare Other

## 2019-12-25 ENCOUNTER — Telehealth: Payer: Self-pay | Admitting: Hematology

## 2019-12-25 DIAGNOSIS — J309 Allergic rhinitis, unspecified: Secondary | ICD-10-CM | POA: Diagnosis not present

## 2019-12-25 NOTE — Telephone Encounter (Signed)
Received a new hem referral from Jodi Mourning for high bone marrow iron stores. Pt has been cld and scheduled to see Dr. Irene Limbo on 1/12 at 1pm. Pt aware to arrive 15 minutes early.

## 2019-12-31 ENCOUNTER — Ambulatory Visit (INDEPENDENT_AMBULATORY_CARE_PROVIDER_SITE_OTHER): Payer: Medicare Other | Admitting: *Deleted

## 2019-12-31 ENCOUNTER — Telehealth: Payer: Self-pay | Admitting: Oncology

## 2019-12-31 DIAGNOSIS — J309 Allergic rhinitis, unspecified: Secondary | ICD-10-CM

## 2019-12-31 NOTE — Telephone Encounter (Signed)
Heidi Mendoza has been rescheduled to see Dr. Benay Spice on 1/12 at 2pm. I cld and lft a vm with the new time and provider change. I asked that she cld me back to confirm she received my msg.

## 2020-01-03 ENCOUNTER — Ambulatory Visit (AMBULATORY_SURGERY_CENTER): Payer: Medicare Other | Admitting: *Deleted

## 2020-01-03 ENCOUNTER — Other Ambulatory Visit: Payer: Self-pay

## 2020-01-03 VITALS — Temp 96.6°F | Ht 65.0 in | Wt 224.0 lb

## 2020-01-03 DIAGNOSIS — Z1211 Encounter for screening for malignant neoplasm of colon: Secondary | ICD-10-CM

## 2020-01-03 DIAGNOSIS — Z1159 Encounter for screening for other viral diseases: Secondary | ICD-10-CM

## 2020-01-03 MED ORDER — NA SULFATE-K SULFATE-MG SULF 17.5-3.13-1.6 GM/177ML PO SOLN: ORAL | 0 refills | Status: DC

## 2020-01-03 NOTE — Progress Notes (Signed)
Patient is here in-person for PV. Patient denies any allergies to eggs or soy. Patient denies any problems with anesthesia/sedation. Patient denies any oxygen use at home. Patient denies taking any blood thinners. Patient will stop taking diet pill supplement starting today. Patient is not being treated for MRSA or C-diff.   COVID-19 screening test is on 1/13, the pt is aware. Pt is aware that care partner will wait in the car during procedure; if they feel like they will be too hot or cold to wait in the car; they may wait in the 4 th floor lobby. Patient is aware to bring only one care partner. We want them to wear a mask (we do not have any that we can provide them), practice social distancing, and we will check their temperatures when they get here.  I did remind the patient that their care partner needs to stay in the parking lot the entire time and have a cell phone available, we will call them when the pt is ready for discharge. Patient will wear mask into building.

## 2020-01-07 ENCOUNTER — Encounter: Payer: Medicare Other | Attending: Family | Admitting: Dietician

## 2020-01-07 ENCOUNTER — Other Ambulatory Visit: Payer: Self-pay

## 2020-01-07 ENCOUNTER — Ambulatory Visit (INDEPENDENT_AMBULATORY_CARE_PROVIDER_SITE_OTHER): Payer: Medicare Other

## 2020-01-07 DIAGNOSIS — J309 Allergic rhinitis, unspecified: Secondary | ICD-10-CM

## 2020-01-07 DIAGNOSIS — Z713 Dietary counseling and surveillance: Secondary | ICD-10-CM | POA: Diagnosis not present

## 2020-01-07 DIAGNOSIS — E669 Obesity, unspecified: Secondary | ICD-10-CM

## 2020-01-07 NOTE — Progress Notes (Signed)
Medical Nutrition Therapy  Appt Start Time: 3:15pm    End Time: 4:00pm  Primary concerns today: weight management   Referral diagnosis: E66.01- class 3 severe obesity w/ serious comorbidity in adult  Preferred learning style: no preference indicated Learning readiness: change in progress   NUTRITION ASSESSMENT   Clinical Medical Hx: hx of gastric bypass 2004  Lifestyle & Dietary Hx Patient states she used to drink a lot of Diet Mtn Dew. Used to snack on chips, cheese, etc at night. Drinks lots of tea (6 cups) throughout the day plus carbonated water. Currently following recipes from "Instant Loss" cookbook, good quality foods and patient states she enjoys the recipes.   Supplements: doTerra brand MVI and others  Sleep: good quality and quantity  Current average weekly physical activity: ADLs  24-Hr Dietary Recall First Meal: oat yogurt + homemade granola  Snack: - Second Meal: carrots  Snack: - Third Meal: meal with beans, vegetables  Snack: - Beverages: tea, carbonated water  Estimated Energy Needs Calories: 1500-1600 Carbohydrate: 170-180g Protein: 94-100g Fat: 50-53g   NUTRITION DIAGNOSIS  Food and nutrition related knowledge deficit (NB-1.1) related to bariatric surgery nutrition as evidenced by questions regarding proper nutrition for weight management with history of bariatric surgery.    NUTRITION INTERVENTION  Nutrition education (E-1) on the following topics:  . General, healthful nutrition  . Bariatric nutrition and goals   Handouts Provided Include   Phase 7: Lifelong Maintenance   Bariatric MyPlate  Bariatric Vitamins & Minerals   Learning Style & Readiness for Change Teaching method utilized: Visual & Auditory  Demonstrated degree of understanding via: Teach Back  Barriers to learning/adherence to lifestyle change: None Identified    MONITORING & EVALUATION Dietary intake, weekly physical activity, and goals prn.  Next Steps  Patient is to  contact [facility] to schedule follow up visit as needed/desired.

## 2020-01-08 ENCOUNTER — Inpatient Hospital Stay: Payer: Medicare Other | Admitting: Oncology

## 2020-01-08 ENCOUNTER — Encounter: Payer: Self-pay | Admitting: Dietician

## 2020-01-08 ENCOUNTER — Encounter: Payer: Medicare Other | Admitting: Hematology

## 2020-01-10 ENCOUNTER — Telehealth: Payer: Self-pay | Admitting: Gastroenterology

## 2020-01-10 ENCOUNTER — Other Ambulatory Visit: Payer: Self-pay | Admitting: Gastroenterology

## 2020-01-10 ENCOUNTER — Ambulatory Visit (INDEPENDENT_AMBULATORY_CARE_PROVIDER_SITE_OTHER): Payer: Medicare Other

## 2020-01-10 ENCOUNTER — Other Ambulatory Visit: Payer: Medicare Other

## 2020-01-10 DIAGNOSIS — Z1159 Encounter for screening for other viral diseases: Secondary | ICD-10-CM

## 2020-01-10 NOTE — Telephone Encounter (Signed)
New Covid screening test made for today at 250pm. Pt is aware.

## 2020-01-11 LAB — SARS CORONAVIRUS 2 (TAT 6-24 HRS): SARS Coronavirus 2: NEGATIVE

## 2020-01-14 ENCOUNTER — Other Ambulatory Visit: Payer: Self-pay

## 2020-01-14 ENCOUNTER — Ambulatory Visit (AMBULATORY_SURGERY_CENTER): Payer: Medicare Other | Admitting: Gastroenterology

## 2020-01-14 ENCOUNTER — Encounter: Payer: Self-pay | Admitting: Gastroenterology

## 2020-01-14 VITALS — BP 111/71 | HR 83 | Temp 98.2°F | Resp 13 | Ht 65.0 in | Wt 224.0 lb

## 2020-01-14 DIAGNOSIS — Z1211 Encounter for screening for malignant neoplasm of colon: Secondary | ICD-10-CM

## 2020-01-14 MED ORDER — SODIUM CHLORIDE 0.9 % IV SOLN: 500.0000 mL | Freq: Once | INTRAVENOUS | Status: DC

## 2020-01-14 NOTE — Progress Notes (Signed)
Report to PACU, RN, vss, BBS= Clear.  

## 2020-01-14 NOTE — Patient Instructions (Signed)
YOU HAD AN ENDOSCOPIC PROCEDURE TODAY AT [facility]:   Refer to the procedure report that was given to you for any specific questions about what was found during the examination.  If the procedure report does not answer your questions, please call your gastroenterologist to clarify.  If you requested that your care partner not be given the details of your procedure findings, then the procedure report has been included in a sealed envelope for you to review at your convenience later.  YOU SHOULD EXPECT: Some feelings of bloating in the abdomen. Passage of more gas than usual.  Walking can help get rid of the air that was put into your GI tract during the procedure and reduce the bloating. If you had a lower endoscopy (such as a colonoscopy or flexible sigmoidoscopy) you may notice spotting of blood in your stool or on the toilet paper. If you underwent a bowel prep for your procedure, you may not have a normal bowel movement for a few days.  Please Note:  You might notice some irritation and congestion in your nose or some drainage.  This is from the oxygen used during your procedure.  There is no need for concern and it should clear up in a day or so.  SYMPTOMS TO REPORT IMMEDIATELY:   Following lower endoscopy (colonoscopy or flexible sigmoidoscopy):  Excessive amounts of blood in the stool  Significant tenderness or worsening of abdominal pains  Swelling of the abdomen that is new, acute  Fever of 100F or higher   For urgent or emergent issues, a gastroenterologist can be reached at any hour by calling [phone].   DIET:  We do recommend a small meal at first, but then you may proceed to your regular diet.  Drink plenty of fluids but you should avoid alcoholic beverages for 24 hours.  ACTIVITY:  You should plan to take it easy for the rest of today and you should NOT DRIVE or use heavy machinery until tomorrow (because of the sedation medicines used during the test).     FOLLOW UP: Our staff will call the number listed on your records 48-72 hours following your procedure to check on you and address any questions or concerns that you may have regarding the information given to you following your procedure. If we do not reach you, we will leave a message.  We will attempt to reach you two times.  During this call, we will ask if you have developed any symptoms of COVID 19. If you develop any symptoms (ie: fever, flu-like symptoms, shortness of breath, cough etc.) before then, please call [phone].  If you test positive for Covid 19 in the 2 weeks post procedure, please call and report this information to [place].    If any biopsies were taken you will be contacted by phone or by letter within the next 1-3 weeks.  Please call us at [phone] if you have not heard about the biopsies in 3 weeks.    SIGNATURES/CONFIDENTIALITY: You and/or your care partner have signed paperwork which will be entered into your electronic medical record.  These signatures attest to the fact that that the information above on your After Visit Summary has been reviewed and is understood.  Full responsibility of the confidentiality of this discharge information lies with you and/or your care-partner.    Handouts were given to you on diverticulosis, hemorrhoids and a high fiber diet with liberal fluid intake. You may resume your current medications today.  Repeat colonoscopy in 5 years, after 2 day prep, for screening purposes.  Please call sooner if you have symptoms or there is a change in your family history.   Please call if any questions or concerns.

## 2020-01-14 NOTE — Op Note (Signed)
[facility] Patient Name: Heidi Mendoza Procedure Date: 01/14/2020 11:01 AM MRN: [id number] Endoscopist: Jackquline Denmark , MD Age: 69 Referring MD:  Date of Birth: [birth year] Gender: Female Account #: [id number] Procedure:                Colonoscopy Indications:              Screening for colorectal malignant neoplasm Medicines:                Monitored Anesthesia Care Procedure:                Pre-Anesthesia Assessment:                           - Prior to the procedure, a History and Physical                            was performed, and patient medications and                            allergies were reviewed. The patient's tolerance of                            previous anesthesia was also reviewed. The risks                            and benefits of the procedure and the sedation                            options and risks were discussed with the patient.                            All questions were answered, and informed consent                            was obtained. Prior Anticoagulants: The patient has                            taken no previous anticoagulant or antiplatelet                            agents. ASA Grade Assessment: II - A patient with                            mild systemic disease. After reviewing the risks                            and benefits, the patient was deemed in                            satisfactory condition to undergo the procedure.                           After obtaining informed consent, the colonoscope  was passed under direct vision. Throughout the                            procedure, the patient's blood pressure, pulse, and                            oxygen saturations were monitored continuously. The                            Colonoscope was introduced through the anus and                            advanced to the 2 cm into the ileum. The patient                            tolerated the procedure  well. The quality of the                            bowel preparation was adequate to identify polyps.                            Some retained and adherent stool especially in the                            left colon which could not be fully washed. As the                            exam was to some extent limited. The terminal                            ileum, ileocecal valve, appendiceal orifice, and                            rectum were photographed. The colon was highly                            redundant. Scope In: 11:04:09 AM Scope Out: 11:25:10 AM Scope Withdrawal Time: 0 hours 8 minutes 13 seconds  Total Procedure Duration: 0 hours 21 minutes 1 second  Findings:                 A few small-mouthed diverticula were found in the                            sigmoid colon.                           Non-bleeding internal hemorrhoids were found during                            retroflexion. The hemorrhoids were small.                           The terminal ileum appeared normal.  The exam was otherwise without abnormality on                            direct and retroflexion views. Complications:            No immediate complications. Estimated Blood Loss:     Estimated blood loss: none. Impression:               -Mild sigmoid diverticulosis.                           -Non-bleeding internal hemorrhoids.                           -Otherwise normal colonoscopy to TI. The colon was                            highly redundant. Recommendation:           - Patient has a contact number available for                            emergencies. The signs and symptoms of potential                            delayed complications were discussed with the                            patient. Return to normal activities tomorrow.                            Written discharge instructions were provided to the                            patient.                           -  Resume high-fiber diet. Increase water intake.                           - Continue present medications.                           - Repeat colonoscopy in 5 years, after 2 day prep,                            for screening purposes (d/t quality of                            preparation). Earlier, if you have any new problems                            or if there is any change in family history.                           - Return to GI clinic PRN. Jackquline Denmark, MD 01/14/2020 11:31:51 AM This report has  been signed electronically.

## 2020-01-14 NOTE — Progress Notes (Signed)
Pt's states no medical or surgical changes since previsit or office visit.  LC -temp CW - vitals 

## 2020-01-14 NOTE — Progress Notes (Signed)
No problems noted in the recovery room. maw 

## 2020-01-15 ENCOUNTER — Ambulatory Visit (INDEPENDENT_AMBULATORY_CARE_PROVIDER_SITE_OTHER): Payer: Medicare Other

## 2020-01-15 ENCOUNTER — Telehealth: Payer: Self-pay | Admitting: *Deleted

## 2020-01-15 DIAGNOSIS — J302 Other seasonal allergic rhinitis: Secondary | ICD-10-CM | POA: Diagnosis not present

## 2020-01-15 DIAGNOSIS — J3089 Other allergic rhinitis: Secondary | ICD-10-CM

## 2020-01-15 NOTE — Telephone Encounter (Signed)
Left VM for patient that her oncologist, Dr. Trey Paula has not sent her records yet. Faxed request for records to Dr. Earl Lites office at [phone]

## 2020-01-16 ENCOUNTER — Telehealth: Payer: Self-pay

## 2020-01-16 NOTE — Telephone Encounter (Signed)
Left message on follow up call. 

## 2020-01-16 NOTE — Telephone Encounter (Signed)
No answer, left message to call if having any issues or concerns, B.Arsalan Brisbin RN 

## 2020-01-18 ENCOUNTER — Ambulatory Visit: Payer: Medicare Other | Attending: Internal Medicine

## 2020-01-18 ENCOUNTER — Telehealth: Payer: Self-pay | Admitting: Oncology

## 2020-01-18 DIAGNOSIS — Z23 Encounter for immunization: Secondary | ICD-10-CM | POA: Insufficient documentation

## 2020-01-18 NOTE — Progress Notes (Signed)
   Covid-19 Vaccination Clinic  Name:  Kendry Puthoff    MRN: [id number] DOB: [birth year]  01/18/2020  Ms. Selman was observed post Covid-19 immunization for 15 minutes without incidence. She was provided with Vaccine Information Sheet and instruction to access the V-Safe system.   Ms. Kobler was instructed to call 911 with any severe reactions post vaccine: Marland Kitchen Difficulty breathing  . Swelling of your face and throat  . A fast heartbeat  . A bad rash all over your body  . Dizziness and weakness    Immunizations Administered    Name Date Dose VIS Date Route   Pfizer COVID-19 Vaccine 01/18/2020  5:12 PM 0.3 mL 12/07/2019 Intramuscular   Manufacturer: [place]   Lot: [id number]   [place]: [id number]

## 2020-01-18 NOTE — Telephone Encounter (Signed)
Heidi Mendoza returned my call to reschedule her appt w/Dr. Benay Spice to 2/8 at 2pm w/labs at 1:30pm. Pt has been made aware to arrive 15 minutes early.

## 2020-01-20 ENCOUNTER — Encounter: Payer: Self-pay | Admitting: Family

## 2020-01-22 ENCOUNTER — Encounter (INDEPENDENT_AMBULATORY_CARE_PROVIDER_SITE_OTHER): Payer: Self-pay | Admitting: Family Medicine

## 2020-01-22 ENCOUNTER — Other Ambulatory Visit (INDEPENDENT_AMBULATORY_CARE_PROVIDER_SITE_OTHER): Payer: Self-pay | Admitting: Family Medicine

## 2020-01-22 ENCOUNTER — Telehealth: Payer: Self-pay | Admitting: Oncology

## 2020-01-22 ENCOUNTER — Other Ambulatory Visit: Payer: Self-pay | Admitting: *Deleted

## 2020-01-22 ENCOUNTER — Ambulatory Visit (INDEPENDENT_AMBULATORY_CARE_PROVIDER_SITE_OTHER): Payer: Medicare Other | Admitting: Family Medicine

## 2020-01-22 ENCOUNTER — Ambulatory Visit (INDEPENDENT_AMBULATORY_CARE_PROVIDER_SITE_OTHER): Payer: Medicare Other

## 2020-01-22 ENCOUNTER — Other Ambulatory Visit: Payer: Self-pay

## 2020-01-22 VITALS — BP 140/83 | HR 80 | Temp 98.1°F | Ht 66.0 in | Wt 220.0 lb

## 2020-01-22 DIAGNOSIS — F3289 Other specified depressive episodes: Secondary | ICD-10-CM

## 2020-01-22 DIAGNOSIS — Z0289 Encounter for other administrative examinations: Secondary | ICD-10-CM

## 2020-01-22 DIAGNOSIS — E7849 Other hyperlipidemia: Secondary | ICD-10-CM | POA: Diagnosis not present

## 2020-01-22 DIAGNOSIS — R5383 Other fatigue: Secondary | ICD-10-CM

## 2020-01-22 DIAGNOSIS — E538 Deficiency of other specified B group vitamins: Secondary | ICD-10-CM

## 2020-01-22 DIAGNOSIS — R400 Somnolence: Secondary | ICD-10-CM

## 2020-01-22 DIAGNOSIS — J3089 Other allergic rhinitis: Secondary | ICD-10-CM | POA: Diagnosis not present

## 2020-01-22 DIAGNOSIS — R7303 Prediabetes: Secondary | ICD-10-CM

## 2020-01-22 DIAGNOSIS — Z862 Personal history of diseases of the blood and blood-forming organs and certain disorders involving the immune mechanism: Secondary | ICD-10-CM

## 2020-01-22 DIAGNOSIS — J302 Other seasonal allergic rhinitis: Secondary | ICD-10-CM

## 2020-01-22 DIAGNOSIS — E559 Vitamin D deficiency, unspecified: Secondary | ICD-10-CM

## 2020-01-22 DIAGNOSIS — Z6835 Body mass index (BMI) 35.0-35.9, adult: Secondary | ICD-10-CM

## 2020-01-22 DIAGNOSIS — I10 Essential (primary) hypertension: Secondary | ICD-10-CM

## 2020-01-22 DIAGNOSIS — Z9884 Bariatric surgery status: Secondary | ICD-10-CM

## 2020-01-22 DIAGNOSIS — R0602 Shortness of breath: Secondary | ICD-10-CM

## 2020-01-22 NOTE — Progress Notes (Signed)
Office: [phone]  /  Fax: [phone]    Date: January 29, 2020   Appointment Start Time: 12:15pm Duration: 38 minutes Provider: Glennie Isle, Psy.D. Type of Session: Intake for Individual Therapy  Location of Patient: Home Location of Provider: [facility] Type of Contact: Telepsychological Visit via [employer] WebEx  Informed Consent: This provider called Heidi Mendoza at 12:06pm as she did not present for the WebEx appointment. A HIPAA compliant voicemail was left requesting a call back. Heidi Mendoza called back and directions on connecting were provided by front desk staff. The e-mail with the secure link was re-sent. As such, today's appointment was initiated 15 minutes late. Prior to proceeding with today's appointment, two pieces of identifying information were obtained. In addition, Heidi Mendoza's physical location at the time of this appointment was obtained as well a phone number she could be reached at in the event of technical difficulties. Heidi Mendoza and this provider participated in today's telepsychological service.   The provider's role was explained to Heidi Mendoza. The provider reviewed and discussed issues of confidentiality, privacy, and limits therein (e.g., reporting obligations). In addition to verbal informed consent, written informed consent for psychological services was obtained prior to the initial appointment. Since the clinic is not a 24/7 crisis center, mental health emergency resources were shared and this  provider explained MyChart, e-mail, voicemail, and/or other messaging systems should be utilized only for non-emergency reasons. This provider also explained that information obtained during appointments will be placed in Heidi Mendoza's medical record and relevant information will be shared with other providers at [facility] for coordination of care. Moreover, Heidi Mendoza agreed information may be shared with other [facility] providers as needed for  coordination of care. By signing the service agreement document, Heidi Mendoza provided written consent for coordination of care. Prior to initiating telepsychological services, Heidi Mendoza completed an informed consent document, which included the development of a safety plan (i.e., an emergency contact, nearest emergency room, and emergency resources) in the event of an emergency/crisis. Heidi Mendoza expressed understanding of the rationale of the safety plan. Heidi Mendoza verbally acknowledged understanding she is ultimately responsible for understanding her insurance benefits for telepsychological and in-person services. This provider also reviewed confidentiality, as it relates to telepsychological services, as well as the rationale for telepsychological services (i.e., to reduce exposure risk to COVID-19). Heidi Mendoza  acknowledged understanding that appointments cannot be recorded without both party consent and she is aware she is responsible for securing confidentiality on her end of the session. Heidi Mendoza verbally consented to proceed.  Chief Complaint/HPI: Heidi Mendoza was referred by Dr. Briscoe Deutscher due to depression with emotional eating behaviors. Per the note for the initial visit with Dr. Briscoe Deutscher on January 22, 2020, "Heidi Mendoza is struggling with emotional eating and using food for comfort to the extent that it is negatively impacting her health. She has been working on behavior modification techniques to help reduce her emotional eating and has been unsuccessful. She shows no sign of suicidal or homicidal ideations.  She is currently taking Contrave 8-90 two tablets twice daily.  PHQ-9 is 13 today." During the initial appointment, Heidi Mendoza reported experiencing the following: snacking frequently in the evenings, frequently eating larger portions than normal , struggling with emotional eating, frequently skipping breakfast and sometimes skipping lunch. Heidi Mendoza's Food and Mood (modified PHQ-9) score on January 22, 2020 was 13.  During today's  appointment, Heidi Mendoza was verbally administered a questionnaire assessing various behaviors related to emotional eating. Heidi Mendoza endorsed the following: overeat when you  are celebrating, experience food cravings on a regular basis, eat certain foods when you are anxious, stressed, depressed, or your feelings are hurt, use food to help you cope with emotional situations, find food is comforting to you, overeat when you are worried about something, overeat frequently when you are bored or lonely, not worry about what you eat when you are in a good mood, overeat when you are angry at someone just to show them they cannot control you and eat as a reward. She shared she craves snack foods (e.g., crackers, cheese, Cheetos, popcorn, peanuts). Heidi Mendoza believes the onset of emotional eating was likely in childhood and described the current frequency of emotional eating as "every day, every night." In addition, Heidi Mendoza endorsed a history of binge eating. More specifically, she stated she will eat throughout the day and at night she consumes snacks until she is full. Heidi Mendoza stated it occurs every night, but denied feeling out of control and eating rapidly. She also denied engaging in night time eating since starting with the clinic. Heidi Mendoza denied a history of restricting food intake, purging and engagement in other compensatory strategies, and has never been diagnosed with an eating disorder. She also denied a history of treatment for emotional eating. Moreover, Heidi Mendoza indicated she is unsure what triggers emotional eating; however, staying busy makes emotional eating better. She noted she had gastric bypass surgery in 2004. Furthermore, Heidi Mendoza denied other problems of concern.    Mental Status Examination:  Appearance: well groomed and appropriate hygiene  Behavior: appropriate to circumstances Mood: euthymic Affect: mood congruent Speech: normal in rate, volume, and tone Eye Contact: appropriate Psychomotor Activity:  appropriate Gait: unable to assess Thought Process: linear, logical, and goal directed  Thought Content/Perception: denies suicidal and homicidal ideation, plan, and intent and no hallucinations, delusions, bizarre thinking or behavior reported or observed Orientation: time, person, place and purpose of appointment Memory/Concentration: memory, attention, language, and fund of knowledge intact  Insight/Judgment: good  Family & Psychosocial History: Heidi Mendoza reported she is in a relationship and she has two adult children. She indicated she is currently retired, noting she was volunteering at a [employer] prior to [employer]. Prior to retirement, Heidi Mendoza stated she was employed at [employer]. Additionally, Heidi Mendoza shared her highest level of education obtained is a high school diploma. Currently, Heidi Mendoza's social support system consists of her boyfriend and friends. Moreover, Heidi Mendoza stated she resides with her boyfriend and her dog.   Medical History:  Past Medical History:  Diagnosis Date  . Allergy   . Anxiety   . Asthma   . B12 deficiency   . Back pain   . Depression   . Elevated red blood cell count   . Hyperlipidemia   . Hypertension   . Joint pain   . Prediabetes    Past Surgical History:  Procedure Laterality Date  . ABDOMINAL HYSTERECTOMY Bilateral 07/10/2010  . ADENOIDECTOMY    . COLONOSCOPY  15 years ago    [facility]-incomplete due to poor prep per pt  . FOOT SURGERY    . 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 KIDNEY STONE SURGERY N/A 06/05/2009  . KIDNEY STONE SURGERY    . TONSILLECTOMY     Current Outpatient Medications on File Prior to Visit  Medication Sig Dispense Refill  . Black Pepper-Turmeric (TURMERIC COMPLEX/BLACK PEPPER) 3-500 MG CAPS Take 500 mg by mouth.    . Cetirizine HCl (ZYRTEC ALLERGY) 10 MG CAPS     . Cholecalciferol (VITAMIN D3) 1.25 MG (  50000 UT) CAPS Take 2,000 mg by mouth.    . citalopram (CELEXA) 20 MG tablet Take 1 tablet (20 mg total) by mouth daily.  90 tablet 3  . CONTRAVE 8-90 MG TB12     . docusate sodium (STOOL SOFTENER) 100 MG capsule Take 100 mg by mouth 2 (two) times daily.    . Evening Primrose Oil 1000 MG CAPS     . Fluticasone-Salmeterol (ADVAIR) 500-50 MCG/DOSE AEPB Inhale 1 puff into the lungs 2 (two) times daily.    Marland Kitchen OVER THE COUNTER MEDICATION Doterra vitamins and diet supplements    . simvastatin (ZOCOR) 20 MG tablet Take 1 tablet (20 mg total) by mouth daily. 90 tablet 3  . telmisartan (MICARDIS) 20 MG tablet Take 1 tablet (20 mg total) by mouth daily. 90 tablet 3  . UNABLE TO FIND Med Name: Barnett Hatter daily    . vitamin B-12 (CYANOCOBALAMIN) 1000 MCG tablet Take 5,000 mcg by mouth daily.    . vitamin E 400 UNIT capsule Take 400 Units by mouth daily.     No current facility-administered medications on file prior to visit.  Heidi Mendoza denied a history of head injuries and loss of consciousness.   Mental Health History: Heidi Mendoza reported there is no history of therapeutic services. Heidi Mendoza reported there is no history of hospitalizations for psychiatric concerns, and has never met with a psychiatrist. Chart review revealed Heidi Mendoza is prescribed Celexa by her PCP. Heidi Mendoza denied a family history of mental health related concerns. Heidi Mendoza reported there is no history of trauma including psychological, physical  and sexual abuse, as well as neglect.   Heidi Mendoza described her typical mood lately as "pretty good," but described feeling "tired all the time." Aside from concerns noted above and endorsed on the PHQ-9, Karlee reported experiencing decreased motivation and crying spells secondary to feeling overwhelmed with the current structured meal plan. Jany denied current alcohol use. She denied tobacco use. She denied illicit/recreational substance use. Regarding caffeine intake, Seena reported consuming two Monster drinks daily and 2-3 cups of tea daily. Furthermore, Heidi Mendoza indicated she is not experiencing the following: hopelessness, memory concerns,  hallucinations and delusions, paranoia, symptoms of mania (e.g., expansive mood, flighty ideas, decreased need for sleep, engagement in risky behaviors), social withdrawal and panic attacks. She also denied history of and current suicidal ideation, plan, and intent; history of and current homicidal ideation, plan, and intent; and history of and current engagement in self-harm.  The following strengths were reported by Heidi Mendoza: good with people and outgoing. The following strengths were observed by this provider: ability to express thoughts and feelings during the therapeutic session, ability to establish and benefit from a therapeutic relationship, willingness to work toward established goal(s) with the clinic and ability to engage in reciprocal conversation.  Legal History: Iisha reported there is no history of legal involvement.   Structured Assessments Results: The Patient Health Questionnaire-9 (PHQ-9) is a self-report measure that assesses symptoms and severity of depression over the course of the last two weeks. Ahlani obtained a score of 7 suggesting mild depression. Julieana finds the endorsed symptoms to be not difficult at all. [0= Not at all; 1= Several days; 2= More than half the days; 3= Nearly every day] Little interest or pleasure in doing things 1  Feeling down, depressed, or hopeless 0  Trouble falling or staying asleep, or sleeping too much 3  Feeling tired or having little energy 3  Poor appetite or overeating 0  Feeling bad about yourself --- or  that you are a failure or have let yourself or your family down 0  Trouble concentrating on things, such as reading the newspaper or watching television 0  Moving or speaking so slowly that other people could have noticed? Or the opposite --- being so fidgety or restless that you have been moving around a lot more than usual 0  Thoughts that you would be better off dead or hurting yourself in some way 0  PHQ-9 Score 7    The Generalized  Anxiety Disorder-7 (GAD-7) is a brief self-report measure that assesses symptoms of anxiety over the course of the last two weeks. Larcenia obtained a score of 0. [0= Not at all; 1= Several days; 2= Over half the days; 3= Nearly every day] Feeling nervous, anxious, on edge 0  Not being able to stop or control worrying 0  Worrying too much about different things 0  Trouble relaxing 0  Being so restless that it's hard to sit still 0  Becoming easily annoyed or irritable 0  Feeling afraid as if something awful might happen 0  GAD-7 Score 0   Interventions:  Conducted a chart review Focused on rapport building Verbally administered PHQ-9 and GAD-7 for symptom monitoring Verbally administered Food & Mood questionnaire to assess various behaviors related to emotional eating. Provided emphatic reflections and validation Collaborated with patient on a treatment goal  Psychoeducation provided regarding physical versus emotional hunger  Provisional DSM-5 Diagnosis: 311 (F32.8) Other Specified Depressive Disorder, Emotional Eating Behaviors  Plan: Kirbie appears able and willing to participate as evidenced by collaboration on a treatment goal, engagement in reciprocal conversation, and asking questions as needed for clarification. The next appointment will be scheduled in two weeks, which will be via [employer]. The following treatment goal was established: decrease emotional eating. This provider will regularly review the treatment plan and medical chart to keep informed of status changes. Keelin expressed understanding and agreement with the initial treatment plan of care. Ruhee will be sent a handout via e-mail to utilize between now and the next appointment to increase awareness of hunger patterns and subsequent eating. Delbra provided verbal consent during today's appointment for this provider to send the handout via e-mail.

## 2020-01-22 NOTE — Progress Notes (Signed)
Chief Complaint:   OBESITY Heidi Mendoza (MR# [id number]) is a 69 y.o. female who presents for evaluation and treatment of obesity and related comorbidities. Current BMI is Body mass index is 35.51 kg/m. Heidi Mendoza has been struggling with her weight for many years and has been unsuccessful in either losing weight, maintaining weight loss, or reaching her healthy weight goal.  Heidi Mendoza is currently in the action stage of change and ready to dedicate time achieving and maintaining a healthier weight. Heidi Mendoza is interested in becoming our patient and working on intensive lifestyle modifications including (but not limited to) diet and exercise for weight loss.  Heidi Mendoza has history of gastric sleeve surgery.  She says her biggest struggle is grazing throughout the day, eating carbohydrates, and nighttime snacking.  Heidi Mendoza's habits were reviewed today and are as follows: Her family eats meals together, she thinks her family will eat healthier with her, her desired weight loss is 55-60 pounds, she has been heavy most of her life, she started gaining weight in her 85s, her heaviest weight ever was 225 pounds, she crackers, popcorn, cookies, crackers and cheese, cake, nuts, licorice, candy, and chocolate, she snacks frequently in the evenings, she skips breakfast frequently and sometimes skips lunch, she frequently eats larger portions than normal and she struggles with emotional eating.  Depression Screen Heidi Mendoza's Food and Mood (modified PHQ-9) score was 13.  Depression screen [facility] 2/9 01/22/2020  Decreased Interest 3  Down, Depressed, Hopeless 1  PHQ - 2 Score 4  Altered sleeping 0  Tired, decreased energy 3  Change in appetite 3  Feeling bad or failure about yourself  1  Trouble concentrating 2  Moving slowly or fidgety/restless 0  Suicidal thoughts 0  PHQ-9 Score 13  Difficult doing work/chores Somewhat difficult   Subjective:   1. Other fatigue Shukri denies daytime somnolence and admits to waking  up still tired. Patent has a history of symptoms of morning fatigue. Heidi Mendoza generally gets 11 or 12 hours of sleep per night, and states that she has generally restful sleep. Snoring is present. Apneic episodes are not present. Epworth Sleepiness Score is 5.  2. Shortness of breath on exertion [name] notes increasing shortness of breath with exercising and seems to be worsening over time with weight gain. She notes getting out of breath sooner with activity than she used to. This has gotten worse recently. Ceriyah denies shortness of breath at rest or orthopnea.  3. Essential hypertension Review: taking medications as instructed, no medication side effects noted, no chest pain on exertion, no dyspnea on exertion, no swelling of ankles.  Taking telmisartan 20 mg daily for blood pressure control.  BP Readings from Last 3 Encounters:  01/22/20 140/83  01/14/20 111/71  12/18/19 130/78   4. Other hyperlipidemia Heidi Mendoza has hyperlipidemia and has been trying to improve her cholesterol levels with intensive lifestyle modification including a low saturated fat diet, exercise and weight loss. She denies any chest pain, claudication or myalgias.  She is taking simvastatin 20 mg daily.  Lab Results  Component Value Date   ALT 13 12/18/2019   AST 14 12/18/2019   ALKPHOS 91 12/18/2019   BILITOT 0.4 12/18/2019   5. Other depression, with emotional eating Heidi Mendoza is struggling with emotional eating and using food for comfort to the extent that it is negatively impacting her health. She has been working on behavior modification techniques to help reduce her emotional eating and has been unsuccessful. She shows no sign of suicidal  or homicidal ideations.  She is currently taking Contrave 8-90 two tablets twice daily.  PHQ-9 is 13 today.  6. Prediabetes Heidi Mendoza has a diagnosis of prediabetes based on her elevated HgA1c and was informed this puts her at greater risk of developing diabetes. She continues to work on  diet and exercise to decrease her risk of diabetes. She denies nausea or hypoglycemia.  7. Daytime somnolence Heidi Mendoza endorses snoring.  Epworth Sleepiness Score is 5.  8. Vitamin D deficiency She is currently taking vit D 2000 IU daily. She denies nausea, vomiting or muscle weakness.  9. B12 nutritional deficiency She is not a vegetarian.  She does not have a previous diagnosis of pernicious anemia.  She has a history of weight loss surgery.   10. History of gastric bypass Heidi Mendoza has history of gastric bypass in 2004.  11. History of polycythemia Heidi Mendoza has a history of polycythemia.  Assessment/Plan:   1. Other fatigue Heidi Mendoza does feel that her weight is causing her energy to be lower than it should be. Fatigue may be related to obesity, depression or many other causes. Labs will be ordered, and in the meanwhile, [name] will focus on self care including making healthy food choices, increasing physical activity and focusing on stress reduction.  Orders - EKG 12-Lead - Anemia panel - CBC with Differential/Platelet - VITAMIN D 25 Hydroxy (Vit-D Deficiency, Fractures) - T4, free - T3 - TSH  2. Shortness of breath on exertion Heidi Mendoza does feel that she gets out of breath more easily that she used to when she exercises. Heidi Mendoza's shortness of breath appears to be obesity related and exercise induced. She has agreed to work on weight loss and gradually increase exercise to treat her exercise induced shortness of breath. Will continue to monitor closely.  Orders - VITAMIN D 25 Hydroxy (Vit-D Deficiency, Fractures)  3. Essential hypertension Heidi Mendoza is working on healthy weight loss and exercise to improve blood pressure control. We will watch for signs of hypotension as she continues her lifestyle modifications.  4. Other hyperlipidemia Cardiovascular risk and specific lipid/LDL goals reviewed.  We discussed several lifestyle modifications today and Heidi Mendoza will continue to work on diet,  exercise and weight loss efforts. Orders and follow up as documented in patient record.   Counseling Intensive lifestyle modifications are the first line treatment for this issue. . Dietary changes: Increase soluble fiber. Decrease simple carbohydrates. . Exercise changes: Moderate to vigorous-intensity aerobic activity 150 minutes per week if tolerated. . Lipid-lowering medications: see documented in medical record.  Orders - Lipid Panel With LDL/HDL Ratio  5. Other depression, with emotional eating Behavior modification techniques were discussed today to help Avianne deal with her emotional/non-hunger eating behaviors.  Orders and follow up as documented in patient record.   6. Prediabetes Elmyra will continue to work on weight loss, exercise, and decreasing simple carbohydrates to help decrease the risk of diabetes.   Orders - Comprehensive metabolic panel - Hemoglobin A1c - Insulin, random  7. Daytime somnolence Will monitor.  8. Vitamin D deficiency Low Vitamin D level contributes to fatigue and are associated with obesity, breast, and colon cancer. She agrees to continue to continue vitamin D 2000 IU daily and will follow-up for routine testing of Vitamin D, at least 2-3 times per year to avoid over-replacement.  Orders - VITAMIN D 25 Hydroxy (Vit-D Deficiency, Fractures)  9. B12 nutritional deficiency The diagnosis was reviewed with the patient. Counseling provided today, see below. We will continue to monitor. Orders and  follow up as documented in patient record.  Counseling . The body needs vitamin B12: to make red blood cells; to make DNA; and to help the nerves work properly so they can carry messages from the brain to the body.  . The main causes of vitamin B12 deficiency include dietary deficiency, digestive diseases, pernicious anemia, and having a surgery in which part of the stomach or small intestine is removed.  . Certain medicines can make it harder for the body  to absorb vitamin B12. These medicines include: heartburn medications; some antibiotics; some medications used to treat diabetes, gout, and high cholesterol.  . In some cases, there are no symptoms of this condition. If the condition leads to anemia or nerve damage, various symptoms can occur, such as weakness or fatigue, shortness of breath, and numbness or tingling in your hands and feet.   . Treatment:  o May include taking vitamin B12 supplements.  o Avoid alcohol.  o Eat lots of healthy foods that contain vitamin B12: - Beef, pork, chicken, [place], and organ meats, such as liver.  - Seafood: This includes clams, rainbow trout, salmon, tuna, and haddock. Eggs.  - Cereal and dairy products that are fortified: This means that vitamin B12 has been added to the food.   Orders - Vitamin B12, B1  10. History of gastric bypass Ether is at risk for malnutrition due to her previous bariatric surgery.   Counseling  You may need to eat 3 meals and 2 snacks, or 5 small meals each day in order to reach your protein and calorie goals.   Allow at least 15 minutes for each meal so that you can eat mindfully. Listen to your body so that you do not overeat. For most people, your sleeve or pouch will comfortably hold 4-6 ounces.  Eat foods from all food groups. This includes fruits and vegetables, grains, dairy, and meat and other proteins.  Include a protein-rich food at every meal and snack, and eat the protein food first.   You should be taking a Bariatric Multivitamin as well as calcium.   11. History of polycythemia Will monitor.  12. Class 2 severe obesity with serious comorbidity and body mass index (BMI) of 35.0 to 35.9 in adult, unspecified obesity type [facility]) Jakhya is currently in the action stage of change and her goal is to continue with weight loss efforts. I recommend Teralyn begin the structured treatment plan as follows:  She has agreed to the Category 1 Plan.  Exercise goals: No  exercise has been prescribed at this time.   Behavioral modification strategies: increasing lean protein intake, decreasing simple carbohydrates, increasing vegetables and increasing water intake.  She was informed of the importance of frequent follow-up visits to maximize her success with intensive lifestyle modifications for her multiple health conditions. She was informed we would discuss her lab results at her next visit unless there is a critical issue that needs to be addressed sooner. Demetriana agreed to keep her next visit at the agreed upon time to discuss these results.  Objective:   Blood pressure 140/83, pulse 80, temperature 98.1 F (36.7 C), temperature source Oral, height 5\' 6"  (1.676 m), weight 220 lb (99.8 kg), SpO2 100 %. Body mass index is 35.51 kg/m.  EKG: Normal sinus rhythm, rate 77.  Indirect Calorimeter completed today shows a VO2 of 235 and a REE of 1638.  Her calculated basal metabolic rate is 123456 thus her basal metabolic rate is worse than expected.  General:  Cooperative, alert, well developed, in no acute distress. HEENT: Conjunctivae and lids unremarkable. Cardiovascular: Regular rhythm.  Lungs: Normal work of breathing. Neurologic: No focal deficits.   Lab Results  Component Value Date   CREATININE 0.67 12/18/2019   BUN 12 12/18/2019   NA 141 12/18/2019   K 4.0 12/18/2019   CL 103 12/18/2019   CO2 31 12/18/2019   Lab Results  Component Value Date   ALT 13 12/18/2019   AST 14 12/18/2019   ALKPHOS 91 12/18/2019   BILITOT 0.4 12/18/2019   Lab Results  Component Value Date   WBC 4.5 12/18/2019   HGB 13.6 12/18/2019   HCT 41.3 12/18/2019   MCV 87.6 12/18/2019   PLT 190.0 12/18/2019   Lab Results  Component Value Date   IRON 120 12/18/2019   FERRITIN 73.4 12/18/2019   Attestation Statements:   Reviewed by clinician on day of visit: allergies, medications, problem list, medical history, surgical history, family history, social history, and  previous encounter notes.  This is the patient's first visit at [facility]. The patient's NEW PATIENT PACKET was reviewed at length. Included in the packet: current and past health history, medications, allergies, ROS, gynecologic history (women only), surgical history, family history, social history, weight history, weight loss surgery history (for those that have had weight loss surgery), nutritional evaluation, mood and food questionnaire, PHQ9, Epworth questionnaire, sleep habits questionnaire, patient life and health improvement goals questionnaire. These will all be scanned into the patient's chart under media.   During the visit, I independently reviewed the patient's EKG, bioimpedance scale results, and indirect calorimeter results. I used this information to tailor a meal plan for the patient that will help her to lose weight and will improve her obesity-related conditions going forward. I performed a medically necessary appropriate examination and/or evaluation. I discussed the assessment and treatment plan with the patient. The patient was provided an opportunity to ask questions and all were answered. The patient agreed with the plan and demonstrated an understanding of the instructions. Labs were ordered at this visit and will be reviewed at the next visit unless more critical results need to be addressed immediately. Clinical information was updated and documented in the EMR.   Time spent on visit including pre-visit chart review and post-visit care was 60 minutes.   A separate 15 minutes was spent on risk counseling (see above).   I, Water quality scientist, CMA, am acting as Location manager for [employer], DO.  I have reviewed the above documentation for accuracy and completeness, and I agree with the above. Briscoe Deutscher, DO

## 2020-01-22 NOTE — Telephone Encounter (Signed)
Rescheduled per 1/25 sch msg, per pt's request. Called and spoke with pt, confirmed 1/27 appt

## 2020-01-23 ENCOUNTER — Other Ambulatory Visit: Payer: Medicare Other

## 2020-01-23 ENCOUNTER — Inpatient Hospital Stay: Payer: Medicare Other | Attending: Oncology

## 2020-01-23 ENCOUNTER — Other Ambulatory Visit: Payer: Self-pay

## 2020-01-23 LAB — ANEMIA PANEL
Ferritin: 167 ng/mL — ABNORMAL HIGH (ref 15–150)
Folate, Hemolysate: 512 ng/mL
Folate, RBC: 1351 ng/mL (ref >498)
Hematocrit: 37.9 % (ref 34.0–46.6)
Iron Saturation: 39 % (ref 15–55)
Iron: 115 ug/dL (ref 27–139)
Retic Ct Pct: 1.1 % (ref 0.6–2.6)
Total Iron Binding Capacity: 292 ug/dL (ref 250–450)
UIBC: 177 ug/dL (ref 118–369)
Vitamin B-12: 1569 pg/mL — ABNORMAL HIGH (ref 232–1245)

## 2020-01-23 LAB — CBC WITH DIFFERENTIAL (CANCER CENTER ONLY)
Abs Immature Granulocytes: 0 10*3/uL (ref 0.00–0.07)
Basophils Absolute: 0.1 10*3/uL (ref 0.0–0.1)
Basophils Relative: 1 %
Eosinophils Absolute: 0.2 10*3/uL (ref 0.0–0.5)
Eosinophils Relative: 4 %
HCT: 38.4 % (ref 36.0–46.0)
Hemoglobin: 12.7 g/dL (ref 12.0–15.0)
Immature Granulocytes: 0 %
Lymphocytes Relative: 34 %
Lymphs Abs: 1.7 10*3/uL (ref 0.7–4.0)
MCH: 28.7 pg (ref 26.0–34.0)
MCHC: 33.1 g/dL (ref 30.0–36.0)
MCV: 86.9 fL (ref 80.0–100.0)
Monocytes Absolute: 0.4 10*3/uL (ref 0.1–1.0)
Monocytes Relative: 8 %
Neutro Abs: 2.6 10*3/uL (ref 1.7–7.7)
Neutrophils Relative %: 53 %
Platelet Count: 174 10*3/uL (ref 150–400)
RBC: 4.42 MIL/uL (ref 3.87–5.11)
RDW: 12.8 % (ref 11.5–15.5)
WBC Count: 5 10*3/uL (ref 4.0–10.5)
nRBC: 0 % (ref 0.0–0.2)

## 2020-01-23 LAB — CMP (CANCER CENTER ONLY)
ALT: 11 U/L (ref 0–44)
AST: 14 U/L — ABNORMAL LOW (ref 15–41)
Albumin: 3.8 g/dL (ref 3.5–5.0)
Alkaline Phosphatase: 91 U/L (ref 38–126)
Anion gap: 9 (ref 5–15)
BUN: 16 mg/dL (ref 8–23)
CO2: 27 mmol/L (ref 22–32)
Calcium: 8.9 mg/dL (ref 8.9–10.3)
Chloride: 109 mmol/L (ref 98–111)
Creatinine: 0.83 mg/dL (ref 0.44–1.00)
GFR, Est AFR Am: >60 mL/min (ref >60)
GFR, Estimated: >60 mL/min (ref >60)
Glucose, Bld: 107 mg/dL — ABNORMAL HIGH (ref 70–99)
Potassium: 4.1 mmol/L (ref 3.5–5.1)
Sodium: 145 mmol/L (ref 135–145)
Total Bilirubin: 0.5 mg/dL (ref 0.3–1.2)
Total Protein: 6.6 g/dL (ref 6.5–8.1)

## 2020-01-23 LAB — FERRITIN: Ferritin: 111 ng/mL (ref 11–307)

## 2020-01-24 ENCOUNTER — Encounter (INDEPENDENT_AMBULATORY_CARE_PROVIDER_SITE_OTHER): Payer: Self-pay | Admitting: Family Medicine

## 2020-01-24 NOTE — Telephone Encounter (Signed)
Please advise 

## 2020-01-27 LAB — COMPREHENSIVE METABOLIC PANEL
ALT: 12 IU/L (ref 0–32)
AST: 15 IU/L (ref 0–40)
Albumin/Globulin Ratio: 1.7 (ref 1.2–2.2)
Albumin: 4.3 g/dL (ref 3.8–4.8)
Alkaline Phosphatase: 100 IU/L (ref 39–117)
BUN/Creatinine Ratio: 18 (ref 12–28)
BUN: 13 mg/dL (ref 8–27)
Bilirubin Total: 0.3 mg/dL (ref 0.0–1.2)
CO2: 27 mmol/L (ref 20–29)
Calcium: 9.3 mg/dL (ref 8.7–10.3)
Chloride: 104 mmol/L (ref 96–106)
Creatinine, Ser: 0.73 mg/dL (ref 0.57–1.00)
GFR calc Af Amer: 99 mL/min/{1.73_m2} (ref >59)
GFR calc non Af Amer: 86 mL/min/{1.73_m2} (ref >59)
Globulin, Total: 2.6 g/dL (ref 1.5–4.5)
Glucose: 96 mg/dL (ref 65–99)
Potassium: 4.3 mmol/L (ref 3.5–5.2)
Sodium: 142 mmol/L (ref 134–144)
Total Protein: 6.9 g/dL (ref 6.0–8.5)

## 2020-01-27 LAB — HEMOGLOBIN A1C
Est. average glucose Bld gHb Est-mCnc: 126 mg/dL
Hgb A1c MFr Bld: 6 % — ABNORMAL HIGH (ref 4.8–5.6)

## 2020-01-27 LAB — CBC WITH DIFFERENTIAL/PLATELET
Basophils Absolute: 0.1 10*3/uL (ref 0.0–0.2)
Basos: 1 %
EOS (ABSOLUTE): 0.2 10*3/uL (ref 0.0–0.4)
Eos: 4 %
Hematocrit: 41.2 % (ref 34.0–46.6)
Hemoglobin: 14 g/dL (ref 11.1–15.9)
Immature Grans (Abs): 0 10*3/uL (ref 0.0–0.1)
Immature Granulocytes: 0 %
Lymphocytes Absolute: 1.7 10*3/uL (ref 0.7–3.1)
Lymphs: 34 %
MCH: 28.6 pg (ref 26.6–33.0)
MCHC: 34 g/dL (ref 31.5–35.7)
MCV: 84 fL (ref 79–97)
Monocytes Absolute: 0.4 10*3/uL (ref 0.1–0.9)
Monocytes: 7 %
Neutrophils Absolute: 2.7 10*3/uL (ref 1.4–7.0)
Neutrophils: 54 %
Platelets: 173 10*3/uL (ref 150–450)
RBC: 4.89 x10E6/uL (ref 3.77–5.28)
RDW: 12.9 % (ref 11.7–15.4)
WBC: 5.1 10*3/uL (ref 3.4–10.8)

## 2020-01-27 LAB — TSH: TSH: 1.95 u[IU]/mL (ref 0.450–4.500)

## 2020-01-27 LAB — LIPID PANEL WITH LDL/HDL RATIO
Cholesterol, Total: 180 mg/dL (ref 100–199)
HDL: 48 mg/dL (ref >39)
LDL Chol Calc (NIH): 111 mg/dL — ABNORMAL HIGH (ref 0–99)
LDL/HDL Ratio: 2.3 ratio (ref 0.0–3.2)
Triglycerides: 119 mg/dL (ref 0–149)
VLDL Cholesterol Cal: 21 mg/dL (ref 5–40)

## 2020-01-27 LAB — T4, FREE: Free T4: 1.35 ng/dL (ref 0.82–1.77)

## 2020-01-27 LAB — VITAMIN D 25 HYDROXY (VIT D DEFICIENCY, FRACTURES): Vit D, 25-Hydroxy: 31.2 ng/mL (ref 30.0–100.0)

## 2020-01-27 LAB — INSULIN, RANDOM: INSULIN: 8 u[IU]/mL (ref 2.6–24.9)

## 2020-01-27 LAB — VITAMIN B1: Thiamine: 153 nmol/L (ref 66.5–200.0)

## 2020-01-27 LAB — T3: T3, Total: 114 ng/dL (ref 71–180)

## 2020-01-27 LAB — VITAMIN B12: Vitamin B-12: 1462 pg/mL — ABNORMAL HIGH (ref 232–1245)

## 2020-01-28 ENCOUNTER — Ambulatory Visit (INDEPENDENT_AMBULATORY_CARE_PROVIDER_SITE_OTHER): Payer: Medicare Other

## 2020-01-28 DIAGNOSIS — J309 Allergic rhinitis, unspecified: Secondary | ICD-10-CM | POA: Diagnosis not present

## 2020-01-29 ENCOUNTER — Other Ambulatory Visit: Payer: Self-pay

## 2020-01-29 ENCOUNTER — Ambulatory Visit (INDEPENDENT_AMBULATORY_CARE_PROVIDER_SITE_OTHER): Payer: Medicare Other | Admitting: Psychology

## 2020-01-29 DIAGNOSIS — F3289 Other specified depressive episodes: Secondary | ICD-10-CM

## 2020-01-30 NOTE — Progress Notes (Unsigned)
Office: [phone]  /  Fax: [phone]    Date: February 13, 2020   Appointment Start Time: *** Duration: *** minutes Provider: Glennie Isle, Psy.D. Type of Session: Individual Therapy  Location of Patient: {gbptloc:23249} Location of Provider: {Location of Service:22491} Type of Contact: Telepsychological Visit via {gbtelepsych:23399}  Session Content: Heidi Mendoza is a 69 y.o. female presenting via {gbtelepsych:23399} for a follow-up appointment to address the previously established treatment goal of decreasing emotional eating. Today's appointment was a telepsychological visit due to COVID-19. Heidi Mendoza provided verbal consent for today's telepsychological appointment and she is aware she is responsible for securing confidentiality on her end of the session. Prior to proceeding with today's appointment, Heidi Mendoza's physical location at the time of this appointment was obtained as well a phone number she could be reached at in the event of technical difficulties. Heidi Mendoza and this provider participated in today's telepsychological service.   This provider conducted a brief check-in and verbally administered the PHQ-9 and GAD-7. *** Heidi Mendoza was receptive to today's appointment as evidenced by openness to sharing, responsiveness to feedback, and {gbreceptiveness:23401}.  Mental Status Examination:  Appearance: {Appearance:22431} Behavior: {Behavior:22445} Mood: {gbmood:21757} Affect: {Affect:22436} Speech: {Speech:22432} Eye Contact: {Eye Contact:22433} Psychomotor Activity: {Motor Activity:22434} Gait: {gbgait:23404} Thought Process: {thought process:22448}  Thought Content/Perception: {disturbances:22451} Orientation: {Orientation:22437} Memory/Concentration: {gbcognition:22449} Insight/Judgment: {Insight:22446}  Structured Assessments Results: The Patient Health Questionnaire-9 (PHQ-9) is a self-report measure that assesses symptoms and severity of depression over the course of the last two  weeks. Heidi Mendoza obtained a score of *** suggesting {GBPHQ9SEVERITY:21752}. Heidi Mendoza finds the endorsed symptoms to be {gbphq9difficulty:21754}. [0= Not at all; 1= Several days; 2= More than half the days; 3= Nearly every day] Little interest or pleasure in doing things ***  Feeling down, depressed, or hopeless ***  Trouble falling or staying asleep, or sleeping too much ***  Feeling tired or having little energy ***  Poor appetite or overeating ***  Feeling bad about yourself --- or that you are a failure or have let yourself or your family down ***  Trouble concentrating on things, such as reading the newspaper or watching television ***  Moving or speaking so slowly that other people could have noticed? Or the opposite --- being so fidgety or restless that you have been moving around a lot more than usual ***  Thoughts that you would be better off dead or hurting yourself in some way ***  PHQ-9 Score ***    The Generalized Anxiety Disorder-7 (GAD-7) is a brief self-report measure that assesses symptoms of anxiety over the course of the last two weeks. Heidi Mendoza obtained a score of *** suggesting {gbgad7severity:21753}. Heidi Mendoza finds the endorsed symptoms to be {gbphq9difficulty:21754}. [0= Not at all; 1= Several days; 2= Over half the days; 3= Nearly every day] Feeling nervous, anxious, on edge ***  Not being able to stop or control worrying ***  Worrying too much about different things ***  Trouble relaxing ***  Being so restless that it's hard to sit still ***  Becoming easily annoyed or irritable ***  Feeling afraid as if something awful might happen ***  GAD-7 Score ***   Interventions:  {Interventions for Progress Notes:23405}  DSM-5 Diagnosis: 311 (F32.8) Other Specified Depressive Disorder, Emotional Eating Behaviors  Treatment Goal & Progress: During the initial appointment with this provider, the following treatment goal was established: decrease emotional eating. Heidi Mendoza has demonstrated  progress in her goal as evidenced by {gbtxprogress:22839}. Heidi Mendoza also {gbtxprogress2:22951}.  Plan: The next appointment will be scheduled in {gbweeks:21758}, which will  be {gbtxmodality:23402}. The next session will focus on {Plan for Next Appointment:23400}.

## 2020-02-04 ENCOUNTER — Telehealth: Payer: Self-pay | Admitting: Oncology

## 2020-02-04 ENCOUNTER — Inpatient Hospital Stay: Payer: Medicare Other | Attending: Oncology | Admitting: Oncology

## 2020-02-04 ENCOUNTER — Other Ambulatory Visit: Payer: Self-pay

## 2020-02-04 ENCOUNTER — Inpatient Hospital Stay: Payer: Medicare Other

## 2020-02-04 LAB — CBC WITH DIFFERENTIAL (CANCER CENTER ONLY)
Abs Immature Granulocytes: 0 10*3/uL (ref 0.00–0.07)
Basophils Absolute: 0 10*3/uL (ref 0.0–0.1)
Basophils Relative: 1 %
Eosinophils Absolute: 0.2 10*3/uL (ref 0.0–0.5)
Eosinophils Relative: 3 %
HCT: 38.9 % (ref 36.0–46.0)
Hemoglobin: 13 g/dL (ref 12.0–15.0)
Immature Granulocytes: 0 %
Lymphocytes Relative: 34 %
Lymphs Abs: 1.8 10*3/uL (ref 0.7–4.0)
MCH: 29 pg (ref 26.0–34.0)
MCHC: 33.4 g/dL (ref 30.0–36.0)
MCV: 86.8 fL (ref 80.0–100.0)
Monocytes Absolute: 0.4 10*3/uL (ref 0.1–1.0)
Monocytes Relative: 7 %
Neutro Abs: 3.1 10*3/uL (ref 1.7–7.7)
Neutrophils Relative %: 55 %
Platelet Count: 238 10*3/uL (ref 150–400)
RBC: 4.48 MIL/uL (ref 3.87–5.11)
RDW: 13.4 % (ref 11.5–15.5)
WBC Count: 5.5 10*3/uL (ref 4.0–10.5)
nRBC: 0 % (ref 0.0–0.2)

## 2020-02-04 NOTE — Progress Notes (Signed)
[facility] New Patient Consult   Requesting MD: Marrian Salvage, [place],   [id number]   Heidi Mendoza 69 y.o.  1951/06/13    Reason for Consult: History of an elevated ferritin level   HPI: Heidi Mendoza reports gastric bypass surgery in 2004.  She was placed on following surgery.  She continued iron until she was noted to have an elevated ferritin level in March 2019.  The ferritin returned at 491 on 03/09/2018.  The liver enzymes were normal.  Vitamin B12 level returned to 382.  She started oral B12.  She donated 1 unit of blood, but the ferritin remains elevated 598 on 06/05/2018.  She was referred to Dr. Pearline Cables in [place].  DNA testing for hemochromatosis was negative (we do not have this result available today).  Heidi Mendoza was referred for phlebotomy therapy ports undergone 3 phlebotomy treatments.  On 02/14/2019 the ferritin remained mildly elevated at 192.  Repeat iron studies on 09/28/2019 found a ferritin of 124 (15-1 50), TIBC 245, and percent iron saturation 36.  She has relocated to [place].  She has chronic malaise.  She otherwise feels well.  Past Medical History:  Diagnosis Date  . Allergy   . Anxiety   . Asthma   . B12 deficiency   . Back pain   . Depression   . Elevated red blood cell count   . Hyperlipidemia   . Hypertension   . Joint pain   . Prediabetes     .  G2 P2  Past Surgical History:  Procedure Laterality Date  . ABDOMINAL HYSTERECTOMY Bilateral 07/10/2010  . ADENOIDECTOMY    . COLONOSCOPY  01/14/20   [place]-incomplete due to poor prep per pt  . FOOT SURGERY    . 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 KIDNEY STONE SURGERY N/A 06/05/2009  . KIDNEY STONE SURGERY    . TONSILLECTOMY      Medications: Reviewed  Allergies: No Known Allergies  Family history: Her mother died of renal cell cancer in her 70s  Social History:   She lives in [place].  She is retired after working in a [employer].  She does  not use cigarettes or alcohol.  No transfusion history.  No risk factor for HIV or hepatitis.  She underwent a colonoscopy in January 2021 and is scheduled for mammogram this week.  ROS:   Positives include: Chronic malaise, history of elbow swelling  A complete ROS was otherwise negative.  Physical Exam:  Blood pressure (!) 143/90, pulse 80, temperature 98.3 F (36.8 C), temperature source Temporal, resp. rate 17, height 5\' 6"  (1.676 m), weight 219 lb (99.3 kg), SpO2 98 %.  HEENT: Neck without mass Lungs: Clear bilaterally Cardiac: Regular rate and rhythm Abdomen: No hepatosplenomegaly, no mass, nontender  Vascular: No leg edema Lymph nodes: No cervical, supraclavicular, axillary, or inguinal nodes Neurologic: Alert and oriented, the motor exam appears intact in the upper and lower extremities bilaterally Skin: No rash Musculoskeletal: No spine tenderness, no joint erythema or swelling, synovial hypertrophy at the PIP and DIP joints of the hands bilaterally   LAB:  CBC  Lab Results  Component Value Date   WBC 5.0 01/23/2020   HGB 12.7 01/23/2020   HCT 38.4 01/23/2020   MCV 86.9 01/23/2020   PLT 174 01/23/2020   NEUTROABS 2.6 01/23/2020        CMP  Lab Results  Component Value Date   NA 145 01/23/2020   K  4.1 01/23/2020   CL 109 01/23/2020   CO2 27 01/23/2020   GLUCOSE 107 (H) 01/23/2020   BUN 16 01/23/2020   CREATININE 0.83 01/23/2020   CALCIUM 8.9 01/23/2020   PROT 6.6 01/23/2020   ALBUMIN 3.8 01/23/2020   AST 14 (L) 01/23/2020   ALT 11 01/23/2020   ALKPHOS 91 01/23/2020   BILITOT 0.5 01/23/2020   GFRNONAA >60 01/23/2020   GFRAA >60 01/23/2020       Assessment/Plan:   1. Elevated ferritin level, initially in March 2019 while taking oral iron therapy  Status post a total of 4-6 phlebotomy treatments with normalization of the ferritin, last phlebotomy 05/16/2019  Normal ferritin 09/28/2019 2. History of gastric bypass surgery 3. History of a  borderline low vitamin B12 level, maintained on oral vitamin B12 replacement 4. History of kidney stones 5.   Depression 6.   Hypertension 7.   Hyperlipidemia 8.   Asthma   Disposition:   Heidi Mendoza is referred for hematology follow-up with a history of an elevated ferritin level.  Genetic testing for hemochromatosis was negative.  The etiology of the elevated ferritin level in her case is unclear.  There is no clinical evidence of a systemic inflammatory condition or liver disease.  It is possible the elevated ferritin was related to chronic oral iron therapy, but this would be unusual.  The plan is observation.  She will return to the lab for a ferritin level today.  She will be scheduled for an office visit and ferritin in 6 months.  Betsy Coder, MD  02/04/2020, 3:14 PM

## 2020-02-04 NOTE — Telephone Encounter (Signed)
Scheduled per los. Called and left msg. Mailed printout  °

## 2020-02-05 ENCOUNTER — Other Ambulatory Visit: Payer: Self-pay

## 2020-02-05 ENCOUNTER — Ambulatory Visit (INDEPENDENT_AMBULATORY_CARE_PROVIDER_SITE_OTHER): Payer: Medicare Other

## 2020-02-05 ENCOUNTER — Ambulatory Visit (INDEPENDENT_AMBULATORY_CARE_PROVIDER_SITE_OTHER): Payer: Medicare Other | Admitting: Family Medicine

## 2020-02-05 ENCOUNTER — Encounter (INDEPENDENT_AMBULATORY_CARE_PROVIDER_SITE_OTHER): Payer: Self-pay | Admitting: Family Medicine

## 2020-02-05 ENCOUNTER — Telehealth: Payer: Self-pay

## 2020-02-05 VITALS — BP 138/85 | HR 77 | Temp 98.2°F | Ht 66.0 in | Wt 213.0 lb

## 2020-02-05 DIAGNOSIS — F3289 Other specified depressive episodes: Secondary | ICD-10-CM | POA: Diagnosis not present

## 2020-02-05 DIAGNOSIS — J309 Allergic rhinitis, unspecified: Secondary | ICD-10-CM | POA: Diagnosis not present

## 2020-02-05 DIAGNOSIS — Z9884 Bariatric surgery status: Secondary | ICD-10-CM

## 2020-02-05 DIAGNOSIS — E669 Obesity, unspecified: Secondary | ICD-10-CM

## 2020-02-05 DIAGNOSIS — R7303 Prediabetes: Secondary | ICD-10-CM

## 2020-02-05 DIAGNOSIS — E559 Vitamin D deficiency, unspecified: Secondary | ICD-10-CM

## 2020-02-05 DIAGNOSIS — Z6834 Body mass index (BMI) 34.0-34.9, adult: Secondary | ICD-10-CM | POA: Diagnosis not present

## 2020-02-05 LAB — IRON AND TIBC
Iron: 61 ug/dL (ref 41–142)
Saturation Ratios: 23 % (ref 21–57)
TIBC: 268 ug/dL (ref 236–444)
UIBC: 207 ug/dL (ref 120–384)

## 2020-02-05 LAB — FERRITIN: Ferritin: 112 ng/mL (ref 11–307)

## 2020-02-05 MED ORDER — METFORMIN HCL 500 MG PO TABS: 500.0000 mg | ORAL_TABLET | Freq: Every day | ORAL | 0 refills | Status: DC

## 2020-02-05 MED ORDER — VITAMIN D (ERGOCALCIFEROL) 1.25 MG (50000 UNIT) PO CAPS: 50000.0000 [IU] | ORAL_CAPSULE | ORAL | 0 refills | Status: DC

## 2020-02-05 NOTE — Progress Notes (Signed)
Chief Complaint:   OBESITY Heidi Mendoza is here to discuss her progress with her obesity treatment plan along with follow-up of her obesity related diagnoses. Heidi Mendoza is on the Category 1 Plan and states she is following her eating plan approximately 50% of the time. Heidi Mendoza states she is walking for 20 minutes 3 times per week.  Today's visit was #: 2 Starting weight: 220 lbs Starting date: 01/22/2020 Today's weight: 213 lbs Today's date: 02/05/2020 Total lbs lost to date: 7 lbs Total lbs lost since last in-office visit: 7 lbs  Interim History: Dail says she is unhappy with the Category 1 plan, but would be okay with Vegetarian or Pescatarian plan.  She brought a book of Instapot recipes with her today for approval.  Subjective:   1. Prediabetes Heidi Mendoza has a diagnosis of prediabetes based on her elevated HgA1c and was informed this puts her at greater risk of developing diabetes. She continues to work on diet and exercise to decrease her risk of diabetes. She denies nausea or hypoglycemia.  Lab Results  Component Value Date   HGBA1C 6.0 (H) 01/22/2020   Lab Results  Component Value Date   INSULIN 8.0 01/22/2020   2. Vitamin D deficiency Heidi Mendoza's Vitamin D level was 31.2 on 01/22/2020. She is not currently taking vit D. She denies nausea, vomiting or muscle weakness.  3. History of gastric bypass Heidi Mendoza reports gastric bypass surgery in 2004.  4. Other depression, with emotional eating Heidi Mendoza is struggling with emotional eating and using food for comfort to the extent that it is negatively impacting her health. She has been working on behavior modification techniques to help reduce her emotional eating and has been unsuccessful. She shows no sign of suicidal or homicidal ideations.  Assessment/Plan:   1. Prediabetes Heidi Mendoza will continue to work on weight loss, exercise, and decreasing simple carbohydrates to help decrease the risk of diabetes.   Orders - metFORMIN (GLUCOPHAGE) 500 MG  tablet; Take 1 tablet (500 mg total) by mouth daily.  Dispense: 30 tablet; Refill: 0  2. Vitamin D deficiency Low Vitamin D level contributes to fatigue and are associated with obesity, breast, and colon cancer. She agrees to continue to take prescription Vitamin D @50 ,000 IU every week and will follow-up for routine testing of Vitamin D, at least 2-3 times per year to avoid over-replacement.  Orders - Vitamin D, Ergocalciferol, (DRISDOL) 1.25 MG (50000 UNIT) CAPS capsule; Take 1 capsule (50,000 Units total) by mouth every 7 (seven) days.  Dispense: 4 capsule; Refill: 0  3. History of gastric bypass Heidi Mendoza is at risk for malnutrition due to her previous bariatric surgery.   Counseling  You may need to eat 3 meals and 2 snacks, or 5 small meals each day in order to reach your protein and calorie goals.   Allow at least 15 minutes for each meal so that you can eat mindfully. Listen to your body so that you do not overeat. For most people, your sleeve or pouch will comfortably hold 4-6 ounces.  Eat foods from all food groups. This includes fruits and vegetables, grains, dairy, and meat and other proteins.  Include a protein-rich food at every meal and snack, and eat the protein food first.   You should be taking a Bariatric Multivitamin as well as calcium.   4. Other depression, with emotional eating Behavior modification techniques were discussed today to help Heidi Mendoza deal with her emotional/non-hunger eating behaviors.  Orders and follow up as documented in patient  record.  Patient was referred to Dr. Mallie Mussel, our Bariatric Psychologist, for evaluation due to her elevated PHQ-9 score and significant struggles with emotional eating.  5. Class 1 obesity with serious comorbidity and body mass index (BMI) of 34.0 to 34.9 in adult, unspecified obesity type Heidi Mendoza is currently in the action stage of change. As such, her goal is to continue with weight loss efforts. She has agreed to the [employer].   Instapot recipes are okay.  Exercise goals: Older adults should follow the adult guidelines. When older adults cannot meet the adult guidelines, they should be as physically active as their abilities and conditions will allow.  Older adults should do exercises that maintain or improve balance if they are at risk of falling.   Behavioral modification strategies: increasing lean protein intake and decreasing simple carbohydrates.  Heidi Mendoza has agreed to follow-up with our clinic in 2 weeks. She was informed of the importance of frequent follow-up visits to maximize her success with intensive lifestyle modifications for her multiple health conditions.   Objective:   Blood pressure 138/85, pulse 77, temperature 98.2 F (36.8 C), temperature source Oral, height 5\' 6"  (1.676 m), weight 213 lb (96.6 kg), SpO2 95 %. Body mass index is 34.38 kg/m.  General: Cooperative, alert, well developed, in no acute distress. HEENT: Conjunctivae and lids unremarkable. Cardiovascular: Regular rhythm.  Lungs: Normal work of breathing. Neurologic: No focal deficits.   Lab Results  Component Value Date   CREATININE 0.83 01/23/2020   BUN 16 01/23/2020   NA 145 01/23/2020   K 4.1 01/23/2020   CL 109 01/23/2020   CO2 27 01/23/2020   Lab Results  Component Value Date   ALT 11 01/23/2020   AST 14 (L) 01/23/2020   ALKPHOS 91 01/23/2020   BILITOT 0.5 01/23/2020   Lab Results  Component Value Date   HGBA1C 6.0 (H) 01/22/2020   Lab Results  Component Value Date   INSULIN 8.0 01/22/2020   Lab Results  Component Value Date   TSH 1.950 01/22/2020   Lab Results  Component Value Date   CHOL 180 01/22/2020   HDL 48 01/22/2020   LDLCALC 111 (H) 01/22/2020   TRIG 119 01/22/2020   Lab Results  Component Value Date   WBC 5.5 02/04/2020   HGB 13.0 02/04/2020   HCT 38.9 02/04/2020   MCV 86.8 02/04/2020   PLT 238 02/04/2020   Lab Results  Component Value Date   IRON 61 02/04/2020   TIBC  268 02/04/2020   FERRITIN 112 02/04/2020   Obesity Behavioral Intervention Documentation for Insurance:   Approximately 15 minutes were spent on the discussion below.  ASK: We discussed the diagnosis of obesity with Heidi Mendoza today and Heidi Mendoza agreed to give [place] permission to discuss obesity behavioral modification therapy today.  ASSESS: Heidi Mendoza has the diagnosis of obesity and her BMI today is 34.4. Heidi Mendoza is in the action stage of change.   ADVISE: Heidi Mendoza was educated on the multiple health risks of obesity as well as the benefit of weight loss to improve her health. She was advised of the need for long term treatment and the importance of lifestyle modifications to improve her current health and to decrease her risk of future health problems.  AGREE: Multiple dietary modification options and treatment options were discussed and Heidi Mendoza agreed to follow the recommendations documented in the above note.  ARRANGE: Heidi Mendoza was educated on the importance of frequent visits to treat obesity as outlined per CMS and USPSTF  guidelines and agreed to schedule her next follow up appointment today.  Attestation Statements:   Reviewed by clinician on day of visit: allergies, medications, problem list, medical history, surgical history, family history, social history, and previous encounter notes.  I, Water quality scientist, CMA, am acting as Location manager for [employer], DO.  I have reviewed the above documentation for accuracy and completeness, and I agree with the above. Briscoe Deutscher, DO

## 2020-02-05 NOTE — Telephone Encounter (Signed)
TC to pt per Dr Benay Spice to let her know that iron studies are normal. No further problems or concerns at this time.

## 2020-02-06 ENCOUNTER — Ambulatory Visit
Admission: RE | Admit: 2020-02-06 | Discharge: 2020-02-06 | Disposition: A | Discharge status: Home / Self Care | Payer: Medicare Other | Source: Ambulatory Visit | Attending: Family | Admitting: Family

## 2020-02-06 ENCOUNTER — Encounter (INDEPENDENT_AMBULATORY_CARE_PROVIDER_SITE_OTHER): Payer: Self-pay | Admitting: Family Medicine

## 2020-02-06 DIAGNOSIS — Z1231 Encounter for screening mammogram for malignant neoplasm of breast: Secondary | ICD-10-CM

## 2020-02-06 NOTE — Telephone Encounter (Signed)
Please advise 

## 2020-02-08 ENCOUNTER — Ambulatory Visit: Payer: Medicare Other | Attending: Internal Medicine

## 2020-02-08 DIAGNOSIS — Z23 Encounter for immunization: Secondary | ICD-10-CM | POA: Insufficient documentation

## 2020-02-08 NOTE — Progress Notes (Signed)
   Covid-19 Vaccination Clinic  Name:  Heidi Mendoza    MRN: [id number] DOB: [birth year]  02/08/2020  [name] was observed post Covid-19 immunization for 15 minutes without incidence. She was provided with Vaccine Information Sheet and instruction to access the V-Safe system.   [name] was instructed to call 911 with any severe reactions post vaccine: Marland Kitchen Difficulty breathing  . Swelling of your face and throat  . A fast heartbeat  . A bad rash all over your body  . Dizziness and weakness    Immunizations Administered    Name Date Dose VIS Date Route   Pfizer COVID-19 Vaccine 02/08/2020  4:42 PM 0.3 mL 12/07/2019 Intramuscular   Manufacturer: [place]   Lot: [id number]   [place]: [id number]

## 2020-02-12 ENCOUNTER — Other Ambulatory Visit: Payer: Self-pay

## 2020-02-12 ENCOUNTER — Ambulatory Visit (INDEPENDENT_AMBULATORY_CARE_PROVIDER_SITE_OTHER): Payer: Medicare Other | Admitting: Allergy & Immunology

## 2020-02-12 ENCOUNTER — Encounter: Payer: Self-pay | Admitting: Allergy & Immunology

## 2020-02-12 VITALS — BP 134/78 | HR 81 | Temp 97.2°F | Resp 16 | Ht 65.0 in | Wt 215.8 lb

## 2020-02-12 DIAGNOSIS — J3089 Other allergic rhinitis: Secondary | ICD-10-CM

## 2020-02-12 DIAGNOSIS — J4540 Moderate persistent asthma, uncomplicated: Secondary | ICD-10-CM

## 2020-02-12 DIAGNOSIS — J309 Allergic rhinitis, unspecified: Secondary | ICD-10-CM

## 2020-02-12 DIAGNOSIS — J302 Other seasonal allergic rhinitis: Secondary | ICD-10-CM

## 2020-02-12 MED ORDER — FLUTICASONE PROPIONATE 50 MCG/ACT NA SUSP: NASAL | 5 refills | Status: DC

## 2020-02-12 MED ORDER — ALBUTEROL SULFATE HFA 108 (90 BASE) MCG/ACT IN AERS: 2.0000 | INHALATION_SPRAY | RESPIRATORY_TRACT | 1 refills | Status: DC | PRN

## 2020-02-12 MED ORDER — BUDESONIDE-FORMOTEROL FUMARATE 160-4.5 MCG/ACT IN AERO: 1.0000 | INHALATION_SPRAY | Freq: Two times a day (BID) | RESPIRATORY_TRACT | 5 refills | Status: DC

## 2020-02-12 NOTE — Patient Instructions (Addendum)
1. Moderate persistent asthma, uncomplicated - Lung testing looked good today. - We will decrease you to Symbicort one puff twice daily.  - Daily controller medication(s): Symbicort 160/4.95mcg one puff twice daily with spacer (you can go back to two puffs twice daily if needed) - Prior to physical activity: albuterol 2 puffs 10-15 minutes before physical activity. - Rescue medications: albuterol 4 puffs every 4-6 hours as needed - Asthma control goals:  * Full participation in all desired activities (may need albuterol before activity) * Albuterol use two time or less a week on average (not counting use with activity) * Cough interfering with sleep two time or less a month * Oral steroids no more than once a year * No hospitalizations  2. Seasonal and perennial allergic rhinitis - Continue with allergy shots at the same schedule.  - Continue with fluticasone as needed.  - Be sure to use an antihistamine on shot days.    3. Return in about 6 months (around 08/11/2020). This can be an in-person, a virtual Webex or a telephone follow up visit.   Please inform us of any Emergency Department visits, hospitalizations, or changes in symptoms. Call us before going to the ED for breathing or allergy symptoms since we might be able to fit you in for a sick visit. Feel free to contact us anytime with any questions, problems, or concerns.  It was a pleasure to see you again today!  Websites that have reliable patient information: 1. American Academy of Asthma, Allergy, and Immunology: www.aaaai.org 2. Food Allergy Research and Education (FARE): foodallergy.org 3. Mothers of Asthmatics: http://www.asthmacommunitynetwork.org 4. American College of Allergy, Asthma, and Immunology: www.acaai.org   COVID-19 Vaccine Information can be found at: ShippingScam.co.uk For questions related to vaccine distribution or appointments, please email  [email or web address] or call [phone].     "Like" [place] on Facebook and Instagram for our latest updates!        Make sure you are registered to vote! If you have moved or changed any of your contact information, you will need to get this updated before voting!  In some cases, you MAY be able to register to vote online: [email or web address]

## 2020-02-12 NOTE — Progress Notes (Signed)
FOLLOW UP  Date of Service/Encounter:  02/12/20   Assessment:   Moderate persistent asthma, uncomplicated   Seasonal and perennial allergic rhinitis (grasses, trees, weeds, indoor and outdoor molds, cat, dog, dust mites) - on allergen immunotherapy  Plan/Recommendations:   1. Moderate persistent asthma, uncomplicated - Lung testing looked good today. - We will decrease you to Symbicort one puff twice daily.  - Daily controller medication(s): Symbicort 160/4.48mcg one puff twice daily with spacer (you can go back to two puffs twice daily if needed) - Prior to physical activity: albuterol 2 puffs 10-15 minutes before physical activity. - Rescue medications: albuterol 4 puffs every 4-6 hours as needed - Asthma control goals:  * Full participation in all desired activities (may need albuterol before activity) * Albuterol use two time or less a week on average (not counting use with activity) * Cough interfering with sleep two time or less a month * Oral steroids no more than once a year * No hospitalizations  2. Seasonal and perennial allergic rhinitis - Continue with allergy shots at the same schedule.  - Continue with fluticasone as needed.  - Be sure to use an antihistamine on shot days.    3. Return in about 6 months (around 08/11/2020). This can be an in-person, a virtual Webex or a telephone follow up visit.   Subjective:   Gertrudis Roe is a 69 y.o. female presenting today for follow up of  Chief Complaint  Patient presents with  . Allergic Rhinitis   . Asthma    Kayonna Stefanich has a history of the following: Patient Active Problem List   Diagnosis Date Noted  . Hypertension 12/18/2019  . Hyperlipidemia 12/18/2019  . Anxiety and depression 12/18/2019  . Moderate persistent asthma, uncomplicated [id number]  . Seasonal and perennial allergic rhinitis 01/30/2019    History obtained from: chart review and patient.  Torra is a 69 y.o. female presenting for a follow  up visit.  She was last seen in August 2020.  At that time, her lung testing looked great.  We stopped her montelukast and continued with Symbicort 160/4.5 mcg 2 puffs twice daily and albuterol as needed.  For her allergic rhinitis, we continued with allergy shots at the same schedule.  We also continue with Flonase and an antihistamine as needed.  Asthma/Respiratory Symptom History: She has done well with her asthma. She is open to changing down to a lower dose or changing the frequency. Her breathing has been bad only during physical activity. She has not had any asthma exacerbations in quite some time. She does not remember the last time that she needed prednisone. ACT is 25, indicating excellent asthma control. She has not needed any ED visits or UC visits for her asthma.   Allergic Rhinitis Symptom History: She remains on the antihistamine and fluticasone as needed. She has not needed any antibiotics at all since the last visit. Overall her symptoms have been well controlled. She is very happy with how well she is doing.   Abrey is on allergen immunotherapy. She receives two injections. Immunotherapy script #1 contains molds and dust mites. She currently receives 0.70mL of the RED vial (1/100). Immunotherapy script #2 contains weeds, grasses and dog. She currently receives 0.72mL of the RED vial (1/100). She started shots March of 2020 and reached maintenance in August of 2020. She did have a systemic reaction in September 2020, which has necessitated a slow advance of her allergy shots.   Otherwise, there have been no  changes to her past medical history, surgical history, family history, or social history.    Review of Systems  Constitutional: Negative.  Negative for chills, fever, malaise/fatigue and weight loss.  HENT: Negative.  Negative for congestion, ear discharge and ear pain.   Eyes: Negative for pain, discharge and redness.  Respiratory: Negative for cough, sputum production, shortness  of breath and wheezing.   Cardiovascular: Negative.  Negative for chest pain and palpitations.  Gastrointestinal: Negative for abdominal pain, constipation, diarrhea, heartburn, nausea and vomiting.  Skin: Negative.  Negative for itching and rash.  Neurological: Negative for dizziness and headaches.  Endo/Heme/Allergies: Negative for environmental allergies. Does not bruise/bleed easily.       Objective:   Blood pressure 134/78, pulse 81, temperature (!) 97.2 F (36.2 C), temperature source Temporal, resp. rate 16, height 5\' 5"  (1.651 m), weight 215 lb 12.8 oz (97.9 kg), SpO2 96 %. Body mass index is 35.91 kg/m.   Physical Exam:  Physical Exam  Constitutional: She appears well-developed.  Very pleasant and talkative female.   HENT:  Head: Normocephalic and atraumatic.  Right Ear: Tympanic membrane, external ear and ear canal normal.  Left Ear: Tympanic membrane, external ear and ear canal normal.  Nose: Mucosal edema and rhinorrhea present. No nasal deformity or septal deviation. No epistaxis. Right sinus exhibits no maxillary sinus tenderness and no frontal sinus tenderness. Left sinus exhibits no maxillary sinus tenderness and no frontal sinus tenderness.  Mouth/Throat: Uvula is midline and oropharynx is clear and moist. Mucous membranes are not pale and not dry.  Tonsils are normal bilaterally.  Eyes: Pupils are equal, round, and reactive to light. Conjunctivae and EOM are normal. Right eye exhibits no chemosis and no discharge. Left eye exhibits no chemosis and no discharge. Right conjunctiva is not injected. Left conjunctiva is not injected.  Cardiovascular: Normal rate, regular rhythm and normal heart sounds.  Respiratory: Effort normal and breath sounds normal. No accessory muscle usage. No tachypnea. No respiratory distress. She has no wheezes. She has no rhonchi. She has no rales. She exhibits no tenderness.  Moving air well in all lung fields. No increased work of breathing  noted. No crackles or wheezes noted.  Lymphadenopathy:    She has no cervical adenopathy.  Neurological: She is alert.  Skin: No abrasion, no petechiae and no rash noted. Rash is not papular, not vesicular and not urticarial. No erythema. No pallor.  No eczematous or urticarial lesions noted.   Psychiatric: She has a normal mood and affect.     Diagnostic studies:    Spirometry: results normal (FEV1: 2.38/96%, FVC: 3.17/98%, FEV1/FVC: 75%).    Spirometry consistent with normal pattern.   Allergy Studies: none       Salvatore Marvel, MD  [facility]

## 2020-02-13 ENCOUNTER — Ambulatory Visit (INDEPENDENT_AMBULATORY_CARE_PROVIDER_SITE_OTHER): Payer: Medicare Other | Admitting: Psychology

## 2020-02-13 DIAGNOSIS — F3289 Other specified depressive episodes: Secondary | ICD-10-CM

## 2020-02-13 NOTE — Progress Notes (Signed)
Office: [phone]  /  Fax: [phone]    Date: February 27, 2020   Appointment Start Time: 2:37pm Duration: 22 minutes Provider: Glennie Isle, Psy.D. Type of Session: Individual Therapy  Location of Patient: Home Location of Provider: Provider's Home Type of Contact: Telepsychological Visit via [employer] WebEx  Session Content: This provider called Heidi Mendoza at 2:32pm as she did not present for the WebEx appointment. A HIPAA compliant voicemail was left requesting a call back. This provider tried Heidi Mendoza again at 2:35pm. She answered, noting she called the clinic. The e-mail with the secure link was re-sent. As such, today's appointment was initiated 7 minutes late. Heidi Mendoza is a 69 y.o. female presenting via [place] for a follow-up appointment to address the previously established treatment goal of decreasing emotional eating. Today's appointment was a telepsychological visit due to COVID-19. Heidi Mendoza provided verbal consent for today's telepsychological appointment and she is aware she is responsible for securing confidentiality on her end of the session. Prior to proceeding with today's appointment, Heidi Mendoza's physical location at the time of this appointment was obtained as well a phone number she could be reached at in the event of technical difficulties. Heidi Mendoza and this provider participated in today's telepsychological service.   This provider conducted a brief check-in. Heidi Mendoza reported she is "doing pretty well on [her] diet." Triggers for emotional eating were reviewed. Psychoeducation regarding mindfulness was provided. A handout was provided to [facility] with further information regarding mindfulness, including exercises. This provider also explained the benefit of mindfulness as it relates to emotional eating. Heidi Mendoza was encouraged to engage in the provided exercises between now and the next appointment with this provider. Heidi Mendoza agreed. During today's appointment, Heidi Mendoza was led through a mindfulness  exercise involving her senses. Heidi Mendoza provided verbal consent during today's appointment for this provider to send a handout about mindfulness via e-mail. Heidi Mendoza was receptive to today's appointment as evidenced by openness to sharing, responsiveness to feedback, and willingness to engage in mindfulness exercises to assist with coping.  Mental Status Examination:  Appearance: well groomed and appropriate hygiene  Behavior: appropriate to circumstances Mood: euthymic Affect: mood congruent Speech: normal in rate, volume, and tone Eye Contact: appropriate Psychomotor Activity: appropriate Gait: unable to assess Thought Process: linear, logical, and goal directed  Thought Content/Perception: no hallucinations, delusions, bizarre thinking or behavior reported or observed and no evidence of suicidal and homicidal ideation, plan, and intent Orientation: time, person, place and purpose of appointment Memory/Concentration: memory, attention, language, and fund of knowledge intact  Insight/Judgment: good  Interventions:  Conducted a brief chart review Provided empathic reflections and validation Reviewed content from the previous session Employed supportive psychotherapy interventions to facilitate reduced distress and to improve coping skills with identified stressors Employed motivational interviewing skills to assess patient's willingness/desire to adhere to recommended medical treatments and assignments Psychoeducation provided regarding mindfulness Engaged patient in mindfulness exercise(s) Employed acceptance and commitment interventions to emphasize mindfulness and acceptance without struggle  DSM-5 Diagnosis: 311 (F32.8) Other Specified Depressive Disorder, Emotional Eating Behaviors  Treatment Goal & Progress: During the initial appointment with this provider, the following treatment goal was established: decrease emotional eating. Heidi Mendoza has demonstrated progress in her goal as evidenced  by increased awareness of hunger patterns and increased awareness of triggers for emotional eating. Heidi Mendoza also demonstrates willingness to engage in mindfulness exercises.  Plan: Due to upcoming oral surgery, the next appointment will be scheduled in three weeks, which will be via [employer]. The next session will focus on working towards  the established treatment goal.

## 2020-02-13 NOTE — Progress Notes (Signed)
  Office: [phone]  /  Fax: [phone]    Date: February 13, 2020    Appointment Start Time: 10:38am Duration: 22 minutes Provider: Glennie Isle, Psy.D. Type of Session: Individual Therapy  Location of Patient: Home Location of Provider: Provider's Home Type of Contact: Telepsychological Visit via Telephone Call  Session Content: This provider called Heidi Mendoza at 10:32am as she did not present for the WebEx appointment. Assistance to connect was provided. The e-mail with the secure link was re-sent. Due to connection issues on Heidi Mendoza's end, the appointment continued via a telephone call. As such, today's appointment was initiated 8 minutes late. Heidi Mendoza is a 69 y.o. female presenting via Telephone Call for a follow-up appointment to address the previously established treatment goal of decreasing emotional eating. Today's appointment was a telepsychological visit due to COVID-19. Heidi Mendoza provided verbal consent for today's telepsychological appointment and she is aware she is responsible for securing confidentiality on her end of the session. Prior to proceeding with today's appointment, Heidi Mendoza's physical location at the time of this appointment was obtained as well a phone number she could be reached at in the event of technical difficulties. Heidi Mendoza and this provider participated in today's telepsychological service.   This provider conducted a brief check-in. Heidi Mendoza reported she was switched to a vegetarian meal plan, noting, "I'm off to a good start." Emotional and physical hunger was reviewed. Psychoeducation regarding triggers for emotional eating was provided. Chontel was provided a handout, and encouraged to utilize the handout between now and the next appointment to increase awareness of triggers and frequency. Heidi Mendoza agreed. This provider also discussed behavioral strategies for specific triggers, such as placing the utensil down when conversing to avoid mindless eating. Heidi Mendoza provided verbal consent  during today's appointment for this provider to send a handout about triggers via e-mail. Heidi Mendoza was receptive to today's appointment as evidenced by openness to sharing, responsiveness to feedback, and willingness to explore triggers for emotional eating.  Mental Status Examination:  Appearance: unable to assess  Behavior: unable to assess Mood: euthymic Affect: unable to fully assess Speech: normal in rate, volume, and tone Eye Contact: unable to assess Psychomotor Activity: unable to assess  Gait: unable to assess Thought Process: linear, logical, and goal directed  Thought Content/Perception: no hallucinations, delusions, bizarre thinking or behavior reported or observed and no evidence of suicidal and homicidal ideation, plan, and intent Orientation: time, person, place and purpose of appointment Memory/Concentration: memory, attention, language, and fund of knowledge intact  Insight/Judgment: good  Interventions:  Conducted a brief chart review Provided empathic reflections and validation Reviewed content from the previous session Employed supportive psychotherapy interventions to facilitate reduced distress and to improve coping skills with identified stressors Employed motivational interviewing skills to assess patient's willingness/desire to adhere to recommended medical treatments and assignments Psychoeducation provided regarding triggers for emotional eating  DSM-5 Diagnosis: 311 (F32.8) Other Specified Depressive Disorder, Emotional Eating Behaviors  Treatment Goal & Progress: During the initial appointment with this provider, the following treatment goal was established: decrease emotional eating. Heidi Mendoza has demonstrated progress in her goal as evidenced by increased awareness of hunger patterns.   Plan: The next appointment will be scheduled in two weeks, which will be via [employer]. The next session will focus on working towards the established treatment goal.

## 2020-02-19 ENCOUNTER — Other Ambulatory Visit: Payer: Self-pay | Admitting: *Deleted

## 2020-02-19 ENCOUNTER — Encounter: Payer: Self-pay | Admitting: Family

## 2020-02-19 ENCOUNTER — Other Ambulatory Visit: Payer: Self-pay | Admitting: Allergy & Immunology

## 2020-02-19 ENCOUNTER — Encounter: Payer: Self-pay | Admitting: Allergy & Immunology

## 2020-02-19 ENCOUNTER — Encounter (INDEPENDENT_AMBULATORY_CARE_PROVIDER_SITE_OTHER): Payer: Self-pay | Admitting: Family Medicine

## 2020-02-19 MED ORDER — FLUTICASONE PROPIONATE 50 MCG/ACT NA SUSP: NASAL | 1 refills | Status: AC

## 2020-02-20 ENCOUNTER — Ambulatory Visit (INDEPENDENT_AMBULATORY_CARE_PROVIDER_SITE_OTHER): Payer: Medicare Other | Admitting: Family Medicine

## 2020-02-20 ENCOUNTER — Other Ambulatory Visit: Payer: Self-pay

## 2020-02-20 ENCOUNTER — Encounter (INDEPENDENT_AMBULATORY_CARE_PROVIDER_SITE_OTHER): Payer: Self-pay | Admitting: Family Medicine

## 2020-02-20 VITALS — BP 122/82 | HR 75 | Temp 98.0°F | Ht 66.0 in | Wt 211.0 lb

## 2020-02-20 DIAGNOSIS — E669 Obesity, unspecified: Secondary | ICD-10-CM

## 2020-02-20 DIAGNOSIS — Z6834 Body mass index (BMI) 34.0-34.9, adult: Secondary | ICD-10-CM | POA: Diagnosis not present

## 2020-02-20 DIAGNOSIS — E559 Vitamin D deficiency, unspecified: Secondary | ICD-10-CM | POA: Diagnosis not present

## 2020-02-20 DIAGNOSIS — F3289 Other specified depressive episodes: Secondary | ICD-10-CM

## 2020-02-20 DIAGNOSIS — Z9884 Bariatric surgery status: Secondary | ICD-10-CM

## 2020-02-20 DIAGNOSIS — R7303 Prediabetes: Secondary | ICD-10-CM | POA: Diagnosis not present

## 2020-02-20 MED ORDER — VITAMIN D (ERGOCALCIFEROL) 1.25 MG (50000 UNIT) PO CAPS: 50000.0000 [IU] | ORAL_CAPSULE | ORAL | 0 refills | Status: AC

## 2020-02-20 MED ORDER — BUDESONIDE-FORMOTEROL FUMARATE 160-4.5 MCG/ACT IN AERO: 1.0000 | INHALATION_SPRAY | Freq: Two times a day (BID) | RESPIRATORY_TRACT | 1 refills | Status: AC

## 2020-02-20 MED ORDER — ALBUTEROL SULFATE HFA 108 (90 BASE) MCG/ACT IN AERS: 2.0000 | INHALATION_SPRAY | RESPIRATORY_TRACT | 0 refills | Status: AC | PRN

## 2020-02-20 MED ORDER — CONTRAVE 8-90 MG PO TB12: 4.0000 | ORAL_TABLET | Freq: Two times a day (BID) | ORAL | 0 refills | Status: DC

## 2020-02-20 MED ORDER — METFORMIN HCL 500 MG PO TABS: 500.0000 mg | ORAL_TABLET | Freq: Every day | ORAL | 0 refills | Status: DC

## 2020-02-20 NOTE — Progress Notes (Signed)
VIALS EXP 02-19-21

## 2020-02-20 NOTE — Progress Notes (Signed)
Chief Complaint:   OBESITY Cherell is here to discuss her progress with her obesity treatment plan along with follow-up of her obesity related diagnoses. Derek is on the Category 1 Plan and states she is following her eating plan approximately 50% of the time. Adesire states she is exercising for 0 minutes 0 times per week.  Today's visit was #: 3 Starting weight: 220 lbs Starting date: 01/22/2020 Today's weight: 211 lbs Today's date: 02/20/2020 Total lbs lost to date: 9 lbsf Total lbs lost since last in-office visit: 2 lbs  Interim History: Delisia has been making good decisions.  She reports she is getting a little tired of current meals.  We reviewed "High Protein Bariatric Cookbook".  She has a gum abscess and likely will be having oral surgery in 1-2 weeks.  Subjective:   1. Vitamin D deficiency Timberlynn's Vitamin D level was 31.2 on 01/22/2020. She is currently taking vit D. She denies nausea, vomiting or muscle weakness.  2. Prediabetes Annalynne has a diagnosis of prediabetes based on her elevated HgA1c and was informed this puts her at greater risk of developing diabetes. She continues to work on diet and exercise to decrease her risk of diabetes. She denies nausea or hypoglycemia.  She is taking metformin 500 mg daily.  Lab Results  Component Value Date   HGBA1C 6.0 (H) 01/22/2020   Lab Results  Component Value Date   INSULIN 8.0 01/22/2020   3. History of gastric bypass Ellarae is taking a bariatric multivitamin.  4. Other depression, with emotional eating Damini is struggling with emotional eating and using food for comfort to the extent that it is negatively impacting her health. She has been working on behavior modification techniques to help reduce her emotional eating and has been unsuccessful. She shows no sign of suicidal or homicidal ideations.  She is taking Contrave.  Assessment/Plan:   1. Vitamin D deficiency Low Vitamin D level contributes to fatigue and are  associated with obesity, breast, and colon cancer. She agrees to continue to take prescription Vitamin D @50 ,000 IU every week and will follow-up for routine testing of Vitamin D, at least 2-3 times per year to avoid over-replacement.  Orders - Vitamin D, Ergocalciferol, (DRISDOL) 1.25 MG (50000 UNIT) CAPS capsule; Take 1 capsule (50,000 Units total) by mouth every 7 (seven) days.  Dispense: 12 capsule; Refill: 0  2. Prediabetes Latreace will continue to work on weight loss, exercise, and decreasing simple carbohydrates to help decrease the risk of diabetes.   Orders - metFORMIN (GLUCOPHAGE) 500 MG tablet; Take 1 tablet (500 mg total) by mouth daily.  Dispense: 90 tablet; Refill: 0  3. History of gastric bypass Markeshia is at risk for malnutrition due to her previous bariatric surgery.   Counseling  You may need to eat 3 meals and 2 snacks, or 5 small meals each day in order to reach your protein and calorie goals.   Allow at least 15 minutes for each meal so that you can eat mindfully. Listen to your body so that you do not overeat. For most people, your sleeve or pouch will comfortably hold 4-6 ounces.  Eat foods from all food groups. This includes fruits and vegetables, grains, dairy, and meat and other proteins.  Include a protein-rich food at every meal and snack, and eat the protein food first.   You should be taking a Bariatric Multivitamin as well as calcium.   4. Other depression, with emotional eating Behavior modification techniques were  discussed today to help Andreea deal with her emotional/non-hunger eating behaviors.  Orders and follow up as documented in patient record.   Orders - CONTRAVE 8-90 MG TB12; Take 4 tablets by mouth 2 (two) times daily.  Dispense: 720 tablet; Refill: 0  5. Class 1 obesity with serious comorbidity and body mass index (BMI) of 34.0 to 34.9 in adult, unspecified obesity type Loistine is currently in the action stage of change. As such, her goal is to  continue with weight loss efforts. She has agreed to the Category 1 Plan.   Exercise goals: All adults should avoid inactivity. Some physical activity is better than none, and adults who participate in any amount of physical activity gain some health benefits.  Behavioral modification strategies: increasing lean protein intake and meal planning and cooking strategies.  Yesika has agreed to follow-up with our clinic in 2 weeks. She was informed of the importance of frequent follow-up visits to maximize her success with intensive lifestyle modifications for her multiple health conditions.   Objective:   Blood pressure 122/82, pulse 75, temperature 98 F (36.7 C), temperature source Oral, height 5\' 6"  (1.676 m), weight 211 lb (95.7 kg), SpO2 96 %. Body mass index is 34.06 kg/m.  General: Cooperative, alert, well developed, in no acute distress. HEENT: Conjunctivae and lids unremarkable. Cardiovascular: Regular rhythm.  Lungs: Normal work of breathing. Neurologic: No focal deficits.   Lab Results  Component Value Date   CREATININE 0.83 01/23/2020   BUN 16 01/23/2020   NA 145 01/23/2020   K 4.1 01/23/2020   CL 109 01/23/2020   CO2 27 01/23/2020   Lab Results  Component Value Date   ALT 11 01/23/2020   AST 14 (L) 01/23/2020   ALKPHOS 91 01/23/2020   BILITOT 0.5 01/23/2020   Lab Results  Component Value Date   HGBA1C 6.0 (H) 01/22/2020   Lab Results  Component Value Date   INSULIN 8.0 01/22/2020   Lab Results  Component Value Date   TSH 1.950 01/22/2020   Lab Results  Component Value Date   CHOL 180 01/22/2020   HDL 48 01/22/2020   LDLCALC 111 (H) 01/22/2020   TRIG 119 01/22/2020   Lab Results  Component Value Date   WBC 5.5 02/04/2020   HGB 13.0 02/04/2020   HCT 38.9 02/04/2020   MCV 86.8 02/04/2020   PLT 238 02/04/2020   Lab Results  Component Value Date   IRON 61 02/04/2020   TIBC 268 02/04/2020   FERRITIN 112 02/04/2020   Obesity Behavioral  Intervention:   Approximately 15 minutes were spent on the discussion below.  ASK: We discussed the diagnosis of obesity with [name] today and Bren agreed to give [place] permission to discuss obesity behavioral modification therapy today.  ASSESS: Meredyth has the diagnosis of obesity and her BMI today is 34.1. Theone is in the action stage of change.   ADVISE: Rika was educated on the multiple health risks of obesity as well as the benefit of weight loss to improve her health. She was advised of the need for long term treatment and the importance of lifestyle modifications to improve her current health and to decrease her risk of future health problems.  AGREE: Multiple dietary modification options and treatment options were discussed and Alianys agreed to follow the recommendations documented in the above note.  ARRANGE: Sienna was educated on the importance of frequent visits to treat obesity as outlined per CMS and USPSTF guidelines and agreed to schedule her next  follow up appointment today.  Attestation Statements:   Reviewed by clinician on day of visit: allergies, medications, problem list, medical history, surgical history, family history, social history, and previous encounter notes.  I, Water quality scientist, CMA, am acting as Location manager for [employer], DO.  I have reviewed the above documentation for accuracy and completeness, and I agree with the above. Briscoe Deutscher, DO

## 2020-02-21 ENCOUNTER — Telehealth: Payer: Self-pay

## 2020-02-21 DIAGNOSIS — J301 Allergic rhinitis due to pollen: Secondary | ICD-10-CM | POA: Diagnosis not present

## 2020-02-21 NOTE — Telephone Encounter (Signed)
Medication Requested: simvastatin (ZOCOR) 20 MG tablet  Is medication on med list: Yes  (if no, inform pt they may need an appointment)  Is medication a controled: No  (yes = last OV with PCP)  -Controlled Substances: Adderall, Ritalin, oxycodone, hydrocodone, methadone, alprazolam, etc  Last visit with PCP: 12.22.20   Is the OV > than 4 months: (yes = schedule an appt if one is not already made)  Pharmacy (Name, [place]): [facility] 90 days supply with  3 refill

## 2020-02-21 NOTE — Telephone Encounter (Signed)
Sent my-chart a message that she still had refills left and to contact her pharmacy for refill.

## 2020-02-25 ENCOUNTER — Ambulatory Visit (INDEPENDENT_AMBULATORY_CARE_PROVIDER_SITE_OTHER): Payer: Medicare Other

## 2020-02-25 DIAGNOSIS — J309 Allergic rhinitis, unspecified: Secondary | ICD-10-CM | POA: Diagnosis not present

## 2020-02-26 ENCOUNTER — Encounter (INDEPENDENT_AMBULATORY_CARE_PROVIDER_SITE_OTHER): Payer: Self-pay | Admitting: Family Medicine

## 2020-02-26 ENCOUNTER — Other Ambulatory Visit (INDEPENDENT_AMBULATORY_CARE_PROVIDER_SITE_OTHER): Payer: Self-pay

## 2020-02-26 ENCOUNTER — Other Ambulatory Visit: Payer: Self-pay | Admitting: Family

## 2020-02-26 DIAGNOSIS — J3089 Other allergic rhinitis: Secondary | ICD-10-CM | POA: Diagnosis not present

## 2020-02-26 DIAGNOSIS — F3289 Other specified depressive episodes: Secondary | ICD-10-CM

## 2020-02-26 MED ORDER — CONTRAVE 8-90 MG PO TB12: 2.0000 | ORAL_TABLET | Freq: Two times a day (BID) | ORAL | 0 refills | Status: AC

## 2020-02-26 MED ORDER — TELMISARTAN 20 MG PO TABS: 20.0000 mg | ORAL_TABLET | Freq: Every day | ORAL | 2 refills | Status: AC

## 2020-02-27 ENCOUNTER — Other Ambulatory Visit: Payer: Self-pay

## 2020-02-27 ENCOUNTER — Ambulatory Visit (INDEPENDENT_AMBULATORY_CARE_PROVIDER_SITE_OTHER): Payer: Medicare Other | Admitting: Psychology

## 2020-02-27 DIAGNOSIS — F5089 Other specified eating disorder: Secondary | ICD-10-CM | POA: Diagnosis not present

## 2020-02-27 DIAGNOSIS — F3289 Other specified depressive episodes: Secondary | ICD-10-CM

## 2020-03-05 NOTE — Progress Notes (Unsigned)
Office: [phone]  /  Fax: [phone]    Date: March 19, 2020   Appointment Start Time: *** Duration: *** minutes Provider: Glennie Isle, Psy.D. Type of Session: Individual Therapy  Location of Patient: {gbptloc:23249} Location of Provider: {Location of Service:22491} Type of Contact: Telepsychological Visit via {gbtelepsych:23399}  Session Content: Heidi Mendoza is a 69 y.o. female presenting via {gbtelepsych:23399} for a follow-up appointment to address the previously established treatment goal of decreasing emotional eating. Today's appointment was a telepsychological visit due to COVID-19. Heidi Mendoza provided verbal consent for today's telepsychological appointment and she is aware she is responsible for securing confidentiality on her end of the session. Prior to proceeding with today's appointment, Heidi Mendoza's physical location at the time of this appointment was obtained as well a phone number she could be reached at in the event of technical difficulties. Heidi Mendoza and this provider participated in today's telepsychological service.   This provider conducted a brief check-in and verbally administered the PHQ-9 and GAD-7. *** Heidi Mendoza was receptive to today's appointment as evidenced by openness to sharing, responsiveness to feedback, and {gbreceptiveness:23401}.  Mental Status Examination:  Appearance: {Appearance:22431} Behavior: {Behavior:22445} Mood: {gbmood:21757} Affect: {Affect:22436} Speech: {Speech:22432} Eye Contact: {Eye Contact:22433} Psychomotor Activity: {Motor Activity:22434} Gait: {gbgait:23404} Thought Process: {thought process:22448}  Thought Content/Perception: {disturbances:22451} Orientation: {Orientation:22437} Memory/Concentration: {gbcognition:22449} Insight/Judgment: {Insight:22446}  Structured Assessments Results: The Patient Health Questionnaire-9 (PHQ-9) is a self-report measure that assesses symptoms and severity of depression over the course of the last two weeks.  Heidi Mendoza obtained a score of *** suggesting {GBPHQ9SEVERITY:21752}. Heidi Mendoza finds the endorsed symptoms to be {gbphq9difficulty:21754}. [0= Not at all; 1= Several days; 2= More than half the days; 3= Nearly every day] Little interest or pleasure in doing things ***  Feeling down, depressed, or hopeless ***  Trouble falling or staying asleep, or sleeping too much ***  Feeling tired or having little energy ***  Poor appetite or overeating ***  Feeling bad about yourself --- or that you are a failure or have let yourself or your family down ***  Trouble concentrating on things, such as reading the newspaper or watching television ***  Moving or speaking so slowly that other people could have noticed? Or the opposite --- being so fidgety or restless that you have been moving around a lot more than usual ***  Thoughts that you would be better off dead or hurting yourself in some way ***  PHQ-9 Score ***    The Generalized Anxiety Disorder-7 (GAD-7) is a brief self-report measure that assesses symptoms of anxiety over the course of the last two weeks. Makiyah obtained a score of *** suggesting {gbgad7severity:21753}. Heidi Mendoza finds the endorsed symptoms to be {gbphq9difficulty:21754}. [0= Not at all; 1= Several days; 2= Over half the days; 3= Nearly every day] Feeling nervous, anxious, on edge ***  Not being able to stop or control worrying ***  Worrying too much about different things ***  Trouble relaxing ***  Being so restless that it's hard to sit still ***  Becoming easily annoyed or irritable ***  Feeling afraid as if something awful might happen ***  GAD-7 Score ***   Interventions:  {Interventions for Progress Notes:23405}  DSM-5 Diagnosis(es): 311 (F32.8) Other Specified Depressive Disorder, Emotional Eating Behaviors  Treatment Goal & Progress: During the initial appointment with this provider, the following treatment goal was established: decrease emotional eating. Heidi Mendoza has demonstrated  progress in her goal as evidenced by {gbtxprogress:22839}. Heidi Mendoza also {gbtxprogress2:22951}.  Plan: The next appointment will be scheduled in {gbweeks:21758}, which will  be {gbtxmodality:23402}. The next session will focus on {Plan for Next Appointment:23400}.

## 2020-03-11 ENCOUNTER — Ambulatory Visit (INDEPENDENT_AMBULATORY_CARE_PROVIDER_SITE_OTHER): Payer: Medicare Other | Admitting: *Deleted

## 2020-03-11 DIAGNOSIS — J309 Allergic rhinitis, unspecified: Secondary | ICD-10-CM

## 2020-03-12 ENCOUNTER — Ambulatory Visit (INDEPENDENT_AMBULATORY_CARE_PROVIDER_SITE_OTHER): Payer: Medicare Other | Admitting: Family Medicine

## 2020-03-17 ENCOUNTER — Ambulatory Visit (INDEPENDENT_AMBULATORY_CARE_PROVIDER_SITE_OTHER): Payer: Medicare Other | Admitting: Family

## 2020-03-17 ENCOUNTER — Encounter: Payer: Self-pay | Admitting: Family

## 2020-03-17 ENCOUNTER — Other Ambulatory Visit: Payer: Self-pay

## 2020-03-17 VITALS — BP 142/84 | HR 84 | Temp 98.0°F | Ht 66.0 in | Wt 217.2 lb

## 2020-03-17 DIAGNOSIS — E782 Mixed hyperlipidemia: Secondary | ICD-10-CM

## 2020-03-17 DIAGNOSIS — F329 Major depressive disorder, single episode, unspecified: Secondary | ICD-10-CM

## 2020-03-17 DIAGNOSIS — I10 Essential (primary) hypertension: Secondary | ICD-10-CM | POA: Diagnosis not present

## 2020-03-17 DIAGNOSIS — F419 Anxiety disorder, unspecified: Secondary | ICD-10-CM | POA: Diagnosis not present

## 2020-03-17 DIAGNOSIS — F32A Depression, unspecified: Secondary | ICD-10-CM

## 2020-03-17 MED ORDER — IBUPROFEN 800 MG PO TABS: 800.0000 mg | ORAL_TABLET | Freq: Three times a day (TID) | ORAL | 0 refills | Status: AC | PRN

## 2020-03-17 NOTE — Progress Notes (Signed)
Heidi Mendoza is a 69 y.o. female with the following history as recorded in EpicCare:  Patient Active Problem List   Diagnosis Date Noted  . Hypertension 12/18/2019  . Hyperlipidemia 12/18/2019  . Anxiety and depression 12/18/2019  . Moderate persistent asthma, uncomplicated [id number]  . Seasonal and perennial allergic rhinitis 01/30/2019    Current Outpatient Medications  Medication Sig Dispense Refill  . albuterol (PROVENTIL) (2.5 MG/3ML) 0.083% nebulizer solution INHALE CONTENTS OF 1 VIAL (3 ML) VIA NEBULIZER EVERY 6 HOURS AS NEEDED FOR WHEEZING OR SHORTNESS OF BREATH 225 mL 18  . albuterol (VENTOLIN HFA) 108 (90 Base) MCG/ACT inhaler Inhale 2 puffs into the lungs every 4 (four) hours as needed for wheezing or shortness of breath. 54 g 0  . budesonide-formoterol (SYMBICORT) 160-4.5 MCG/ACT inhaler Inhale 1 puff into the lungs 2 (two) times daily. 3 Inhaler 1  . Cetirizine HCl (ZYRTEC ALLERGY) 10 MG CAPS Take 10 mg by mouth daily.     . Cholecalciferol (VITAMIN D3) 1.25 MG (50000 UT) CAPS Take 2,000 mg by mouth daily.     . citalopram (CELEXA) 20 MG tablet Take 1 tablet (20 mg total) by mouth daily. 90 tablet 3  . CONTRAVE 8-90 MG TB12 Take 2 tablets by mouth 2 (two) times daily. 360 tablet 0  . fluticasone (FLONASE) 50 MCG/ACT nasal spray 2 Sprays into each nostril daily as needed. 48 g 1  . ibuprofen (ADVIL) 800 MG tablet Take 1 tablet (800 mg total) by mouth every 8 (eight) hours as needed. 60 tablet 0  . metFORMIN (GLUCOPHAGE) 500 MG tablet Take 1 tablet (500 mg total) by mouth daily. 90 tablet 0  . simvastatin (ZOCOR) 20 MG tablet Take 1 tablet (20 mg total) by mouth daily. 90 tablet 3  . telmisartan (MICARDIS) 20 MG tablet Take 1 tablet (20 mg total) by mouth daily. 90 tablet 2  . UNABLE TO FIND Med Name: Barnett Hatter daily    . vitamin B-12 (CYANOCOBALAMIN) 1000 MCG tablet Take 5,000 mcg by mouth daily.    . Vitamin D, Ergocalciferol, (DRISDOL) 1.25 MG (50000 UNIT) CAPS capsule Take 1  capsule (50,000 Units total) by mouth every 7 (seven) days. 12 capsule 0   No current facility-administered medications for this visit.    Allergies: Patient has no known allergies.  Past Medical History:  Diagnosis Date  . Allergy   . Anxiety   . Asthma   . B12 deficiency   . Back pain   . Depression   . Elevated red blood cell count   . Hyperlipidemia   . Hypertension   . Joint pain   . Prediabetes     Past Surgical History:  Procedure Laterality Date  . ABDOMINAL HYSTERECTOMY Bilateral 07/10/2010  . ADENOIDECTOMY    . COLONOSCOPY  15 years ago    [place]-incomplete due to poor prep per pt  . FOOT SURGERY    . 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 KIDNEY STONE SURGERY N/A 06/05/2009  . KIDNEY STONE SURGERY    . TONSILLECTOMY      Family History  Problem Relation Age of Onset  . Diabetes Mother   . Hyperlipidemia Mother   . Hypertension Mother   . Kidney disease Mother   . Depression Mother   . Anxiety disorder Mother   . Obesity Mother   . Hypertension Father   . Obesity Father   . Colon cancer Neg Hx   . Colon polyps Neg Hx   . Esophageal cancer Neg Hx   .  Rectal cancer Neg Hx   . Stomach cancer Neg Hx     Social History   Tobacco Use  . Smoking status: Former Smoker    Years: 1.00    Types: Cigarettes  . Smokeless tobacco: Never Used  . Tobacco comment: smoked as a teenager, only for 1 year  Substance Use Topics  . Alcohol use: Not Currently    Subjective:  3 month follow-up on chronic care needs- working with weight loss program and optimistic about her program; Is recovering from oral surgery- had 3 teeth pulled last week, 2 implants and grafting done; having increased pain with wearing partial dentures; having to use Ibuprofen 400 mg up to 3x per day;   Immunotherapy 2 shots weekly; Weight loss every 2 weeks; psychiatrist every other week to help with emotional eating;      Objective:  Vitals:   03/17/20 1129  BP: (!) 142/84  Pulse: 84  Temp:  98 F (36.7 C)  TempSrc: Oral  SpO2: 96%  Weight: 217 lb 3.2 oz (98.5 kg)  Height: 5\' 6"  (1.676 m)    General: Well developed, well nourished, in no acute distress  Skin : Warm and dry.  Head: Normocephalic and atraumatic  Lungs: Respirations unlabored; clear to auscultation bilaterally without wheeze, rales, rhonchi  CVS exam: normal rate and regular rhythm.  Neurologic: Alert and oriented; speech intact; face symmetrical; moves all extremities well; CNII-XII intact without focal deficit   Assessment:  1. Essential hypertension   2. Anxiety and depression   3. Mixed hyperlipidemia   4. Class 3 severe obesity with serious comorbidity in adult, unspecified BMI, unspecified obesity type ([place])     Plan:  1. Suspect elevated due to pain from recent oral surgery; per patient, blood pressure has been stable recently; 2. Stable; 3. Stable; 4. Continue working with weight loss specialist;  This visit occurred during the SARS-CoV-2 public health emergency.  Safety protocols were in place, including screening questions prior to the visit, additional usage of staff PPE, and extensive cleaning of exam room while observing appropriate contact time as indicated for disinfecting solutions.      Return in about 9 months (around 12/17/2020).  No orders of the defined types were placed in this encounter.   Requested Prescriptions   Signed Prescriptions Disp Refills  . ibuprofen (ADVIL) 800 MG tablet 60 tablet 0    Sig: Take 1 tablet (800 mg total) by mouth every 8 (eight) hours as needed.

## 2020-03-18 ENCOUNTER — Ambulatory Visit (INDEPENDENT_AMBULATORY_CARE_PROVIDER_SITE_OTHER): Payer: Medicare Other | Admitting: *Deleted

## 2020-03-18 DIAGNOSIS — J309 Allergic rhinitis, unspecified: Secondary | ICD-10-CM | POA: Diagnosis not present

## 2020-03-19 ENCOUNTER — Ambulatory Visit (INDEPENDENT_AMBULATORY_CARE_PROVIDER_SITE_OTHER): Payer: Medicare Other | Admitting: Psychology

## 2020-04-01 ENCOUNTER — Ambulatory Visit (INDEPENDENT_AMBULATORY_CARE_PROVIDER_SITE_OTHER): Payer: Medicare Other

## 2020-04-01 DIAGNOSIS — J309 Allergic rhinitis, unspecified: Secondary | ICD-10-CM | POA: Diagnosis not present

## 2020-04-11 ENCOUNTER — Ambulatory Visit (INDEPENDENT_AMBULATORY_CARE_PROVIDER_SITE_OTHER): Payer: Medicare Other

## 2020-04-11 DIAGNOSIS — J309 Allergic rhinitis, unspecified: Secondary | ICD-10-CM | POA: Diagnosis not present

## 2020-04-16 ENCOUNTER — Ambulatory Visit (INDEPENDENT_AMBULATORY_CARE_PROVIDER_SITE_OTHER): Payer: Medicare Other

## 2020-04-16 DIAGNOSIS — J309 Allergic rhinitis, unspecified: Secondary | ICD-10-CM | POA: Diagnosis not present

## 2020-04-23 ENCOUNTER — Ambulatory Visit (INDEPENDENT_AMBULATORY_CARE_PROVIDER_SITE_OTHER): Payer: Medicare Other

## 2020-04-23 DIAGNOSIS — J309 Allergic rhinitis, unspecified: Secondary | ICD-10-CM | POA: Diagnosis not present

## 2020-04-23 IMAGING — MG DIGITAL SCREENING BILAT W/ TOMO W/ CAD
6 of 12 series · 6 of 36 positions shown · non-contrast
Comparison: Previous exam(s).

CLINICAL DATA: Screening.

EXAM:
DIGITAL SCREENING BILATERAL MAMMOGRAM WITH TOMO AND CAD

[L MLO synth-2D (1 of 2)]
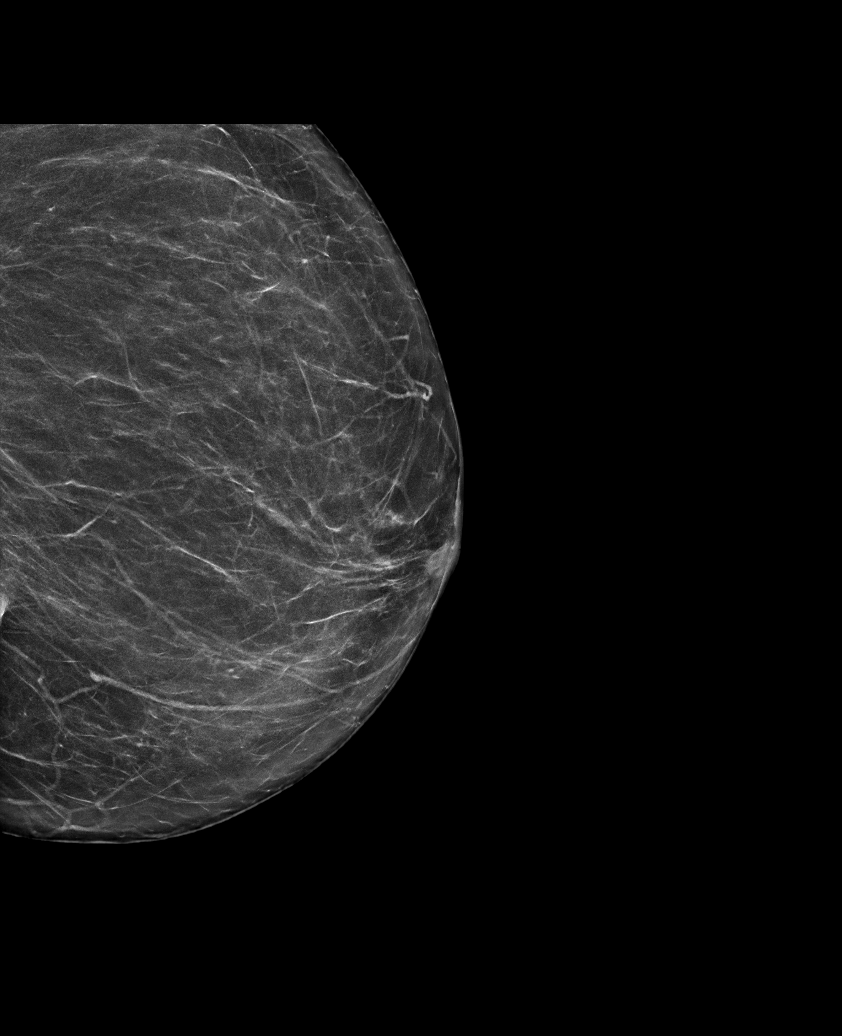

[L MLO synth-2D (2 of 2)]
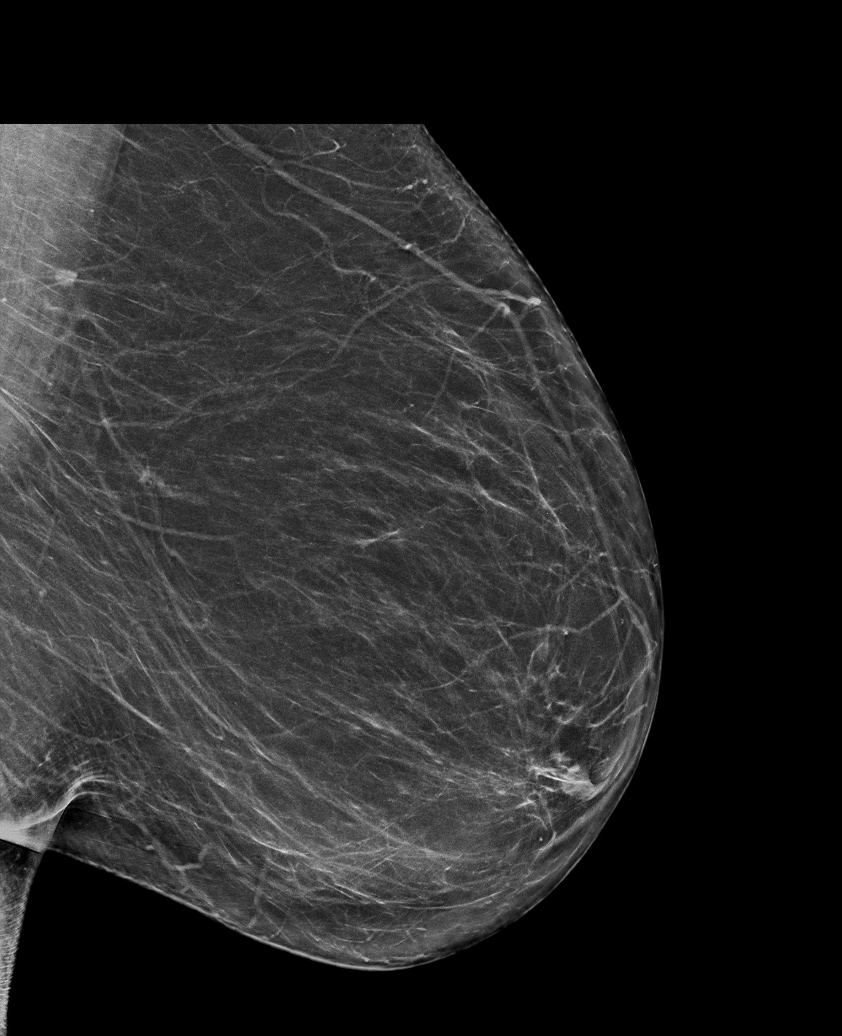

[R MLO synth-2D (1 of 2)]
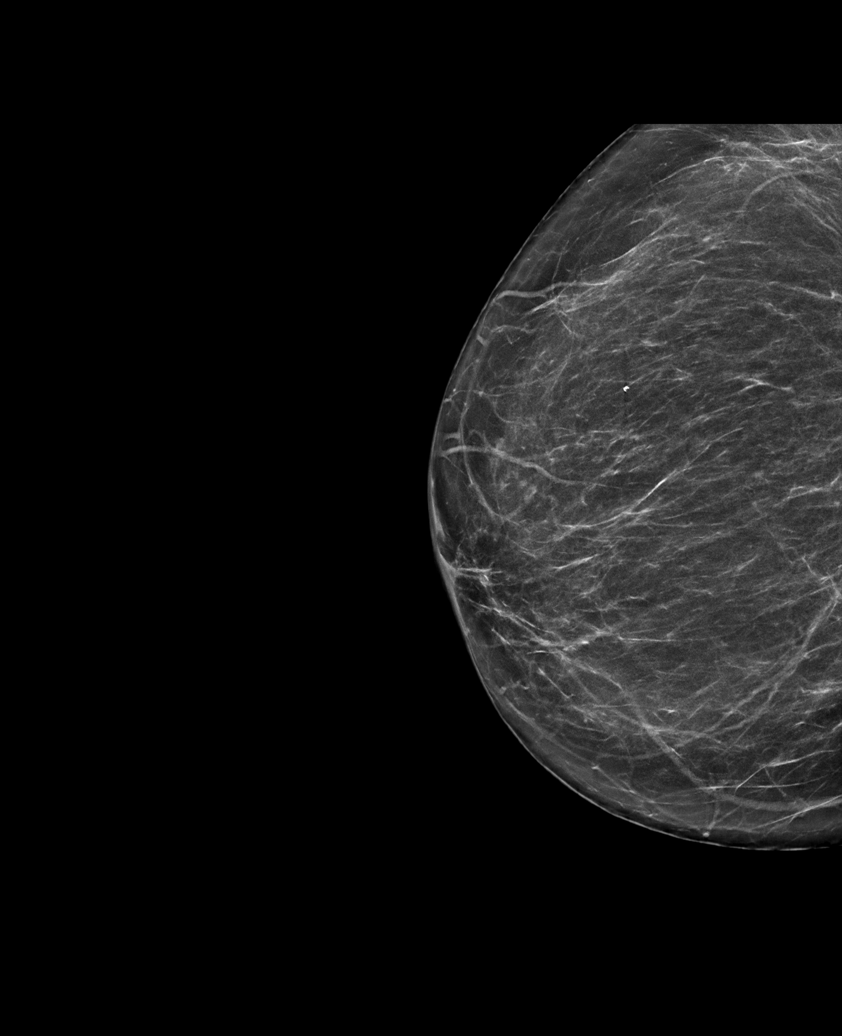

[R MLO synth-2D (2 of 2)]
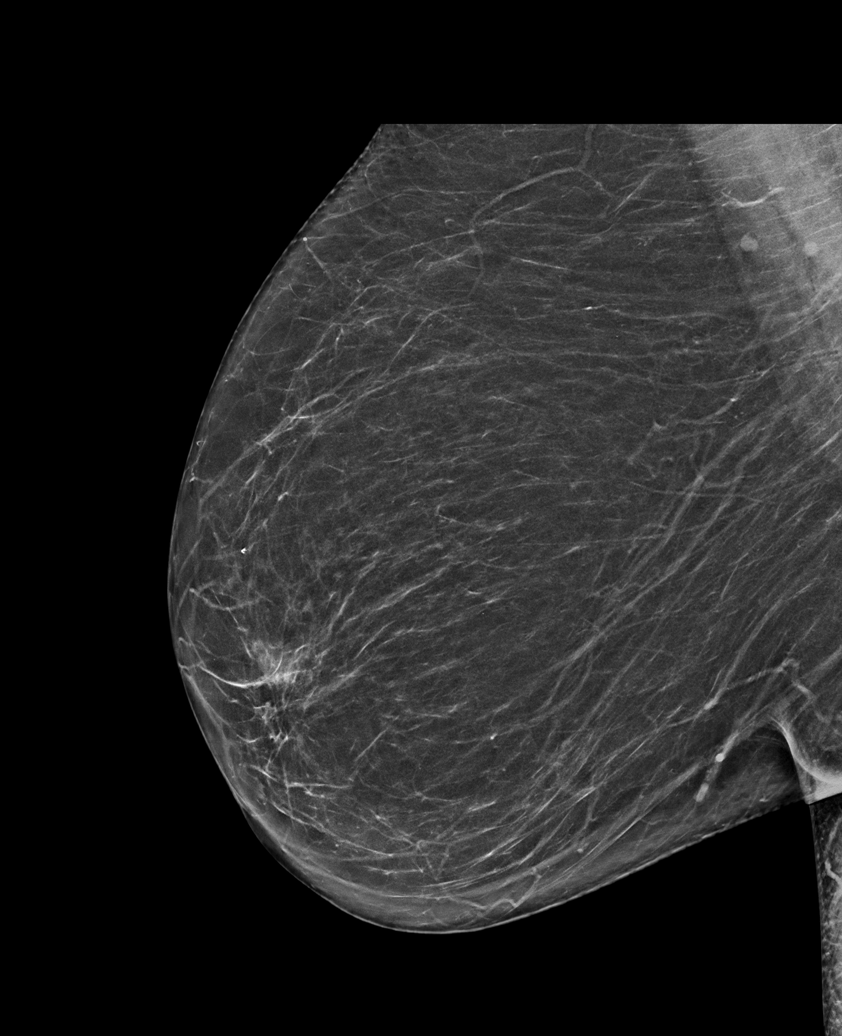

[L CC synth-2D]
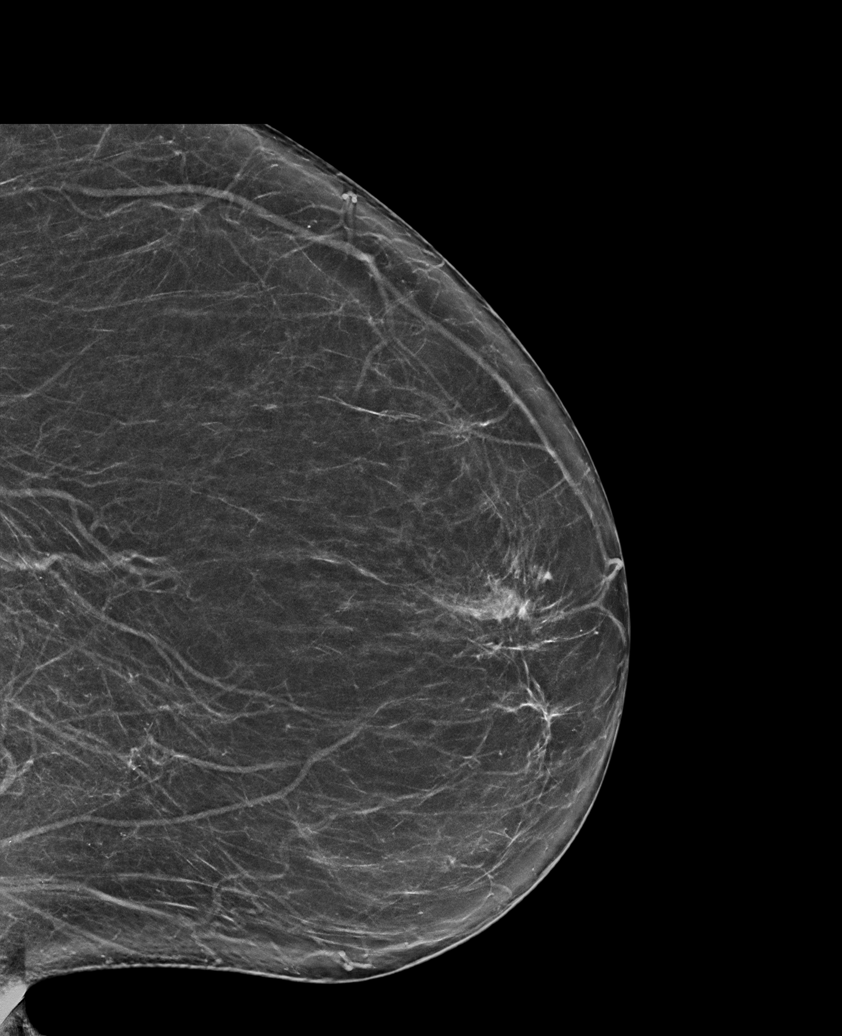

[R CC synth-2D]
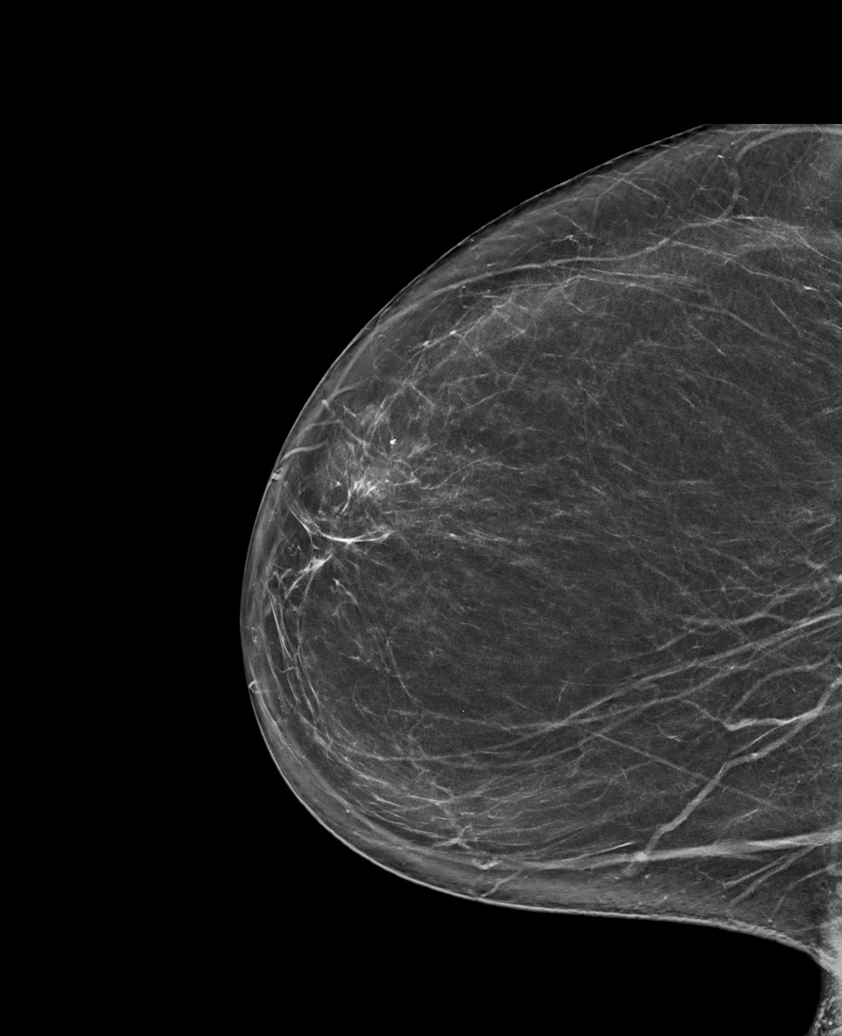

[6 of 36 positions shown; findings below may reference images not displayed]

ACR Breast Density Category b: There are scattered areas of
fibroglandular density.
FINDINGS: There are no findings suspicious for malignancy. Images were
processed with CAD.
IMPRESSION: No mammographic evidence of malignancy. A result letter of this
screening mammogram will be mailed directly to the patient.

RECOMMENDATION:
Screening mammogram in one year. (Code:[id number])

BI-RADS CATEGORY  1: Negative.

## 2020-04-26 ENCOUNTER — Other Ambulatory Visit (INDEPENDENT_AMBULATORY_CARE_PROVIDER_SITE_OTHER): Payer: Self-pay | Admitting: Family Medicine

## 2020-04-26 DIAGNOSIS — E559 Vitamin D deficiency, unspecified: Secondary | ICD-10-CM

## 2020-04-28 ENCOUNTER — Ambulatory Visit (INDEPENDENT_AMBULATORY_CARE_PROVIDER_SITE_OTHER): Payer: Medicare Other

## 2020-04-28 DIAGNOSIS — J309 Allergic rhinitis, unspecified: Secondary | ICD-10-CM | POA: Diagnosis not present

## 2020-05-02 ENCOUNTER — Other Ambulatory Visit (INDEPENDENT_AMBULATORY_CARE_PROVIDER_SITE_OTHER): Payer: Self-pay | Admitting: Family Medicine

## 2020-05-02 DIAGNOSIS — R7303 Prediabetes: Secondary | ICD-10-CM

## 2020-05-05 ENCOUNTER — Encounter: Payer: Self-pay | Admitting: Family

## 2020-05-06 ENCOUNTER — Other Ambulatory Visit: Payer: Self-pay | Admitting: Family

## 2020-05-06 DIAGNOSIS — R7303 Prediabetes: Secondary | ICD-10-CM

## 2020-05-06 MED ORDER — METFORMIN HCL 500 MG PO TABS: 500.0000 mg | ORAL_TABLET | Freq: Every day | ORAL | 0 refills | Status: DC

## 2020-05-08 ENCOUNTER — Ambulatory Visit (INDEPENDENT_AMBULATORY_CARE_PROVIDER_SITE_OTHER): Payer: Medicare Other

## 2020-05-08 DIAGNOSIS — J309 Allergic rhinitis, unspecified: Secondary | ICD-10-CM | POA: Diagnosis not present

## 2020-06-02 ENCOUNTER — Ambulatory Visit (INDEPENDENT_AMBULATORY_CARE_PROVIDER_SITE_OTHER): Payer: Medicare Other

## 2020-06-02 DIAGNOSIS — J309 Allergic rhinitis, unspecified: Secondary | ICD-10-CM | POA: Diagnosis not present

## 2020-06-17 ENCOUNTER — Other Ambulatory Visit (INDEPENDENT_AMBULATORY_CARE_PROVIDER_SITE_OTHER): Payer: Self-pay | Admitting: Family Medicine

## 2020-06-17 DIAGNOSIS — F3289 Other specified depressive episodes: Secondary | ICD-10-CM

## 2020-06-18 ENCOUNTER — Telehealth: Payer: Self-pay | Admitting: Family

## 2020-06-18 NOTE — Telephone Encounter (Signed)
Notified pt w/Laura response../lmb °

## 2020-06-18 NOTE — Telephone Encounter (Signed)
Please let her know that we received a request for Contrave; this is not a medication we would manage for her. This would come from her weight loss provider. It looks like Dr. Juleen China prescribed a 90 day supply in May.

## 2020-06-20 ENCOUNTER — Encounter: Payer: Self-pay | Admitting: Family

## 2020-08-04 ENCOUNTER — Other Ambulatory Visit: Payer: Self-pay | Admitting: Family

## 2020-08-04 ENCOUNTER — Ambulatory Visit: Payer: Medicare Other | Admitting: Family

## 2020-08-04 ENCOUNTER — Inpatient Hospital Stay: Payer: Medicare Other

## 2020-08-04 ENCOUNTER — Inpatient Hospital Stay: Payer: Medicare Other | Admitting: Oncology

## 2020-08-04 DIAGNOSIS — R7303 Prediabetes: Secondary | ICD-10-CM

## 2020-12-15 ENCOUNTER — Telehealth: Payer: Self-pay | Admitting: Family

## 2020-12-15 NOTE — Telephone Encounter (Signed)
Patient calling to make [place] aware she has moved and is now seeing a new provider.

## 2020-12-15 NOTE — Telephone Encounter (Signed)
Patient PCP has been removed due to patient recent move.

## 2020-12-17 ENCOUNTER — Ambulatory Visit: Payer: Medicare Other | Admitting: Family

## 2022-08-04 ENCOUNTER — Encounter (INDEPENDENT_AMBULATORY_CARE_PROVIDER_SITE_OTHER): Payer: Self-pay
